# Patient Record
Sex: Male | Born: 1965 | Race: Black or African American | Hispanic: No | Marital: Married | Smoking: Never smoker
Health system: Southern US, Community
[De-identification: ages and names within clinical notes are randomized; demographics above are authoritative.]

## PROBLEM LIST (undated history)

## (undated) DIAGNOSIS — F419 Anxiety disorder, unspecified: Secondary | ICD-10-CM

## (undated) DIAGNOSIS — E079 Disorder of thyroid, unspecified: Secondary | ICD-10-CM

## (undated) DIAGNOSIS — C9590 Leukemia, unspecified not having achieved remission: Secondary | ICD-10-CM

## (undated) HISTORY — DX: Leukemia, unspecified not having achieved remission: C95.90

## (undated) HISTORY — DX: Anxiety disorder, unspecified: F41.9

## (undated) HISTORY — DX: Disorder of thyroid, unspecified: E07.9

---

## 2016-02-07 ENCOUNTER — Encounter: Payer: Self-pay | Admitting: Hematology

## 2018-01-02 ENCOUNTER — Ambulatory Visit (HOSPITAL_COMMUNITY): Payer: Self-pay | Admitting: Hematology

## 2018-01-14 ENCOUNTER — Other Ambulatory Visit: Payer: Self-pay

## 2018-01-14 ENCOUNTER — Inpatient Hospital Stay (HOSPITAL_COMMUNITY): Payer: 59

## 2018-01-14 ENCOUNTER — Inpatient Hospital Stay (HOSPITAL_COMMUNITY): Payer: 59 | Attending: Hematology | Admitting: Hematology

## 2018-01-14 ENCOUNTER — Encounter (HOSPITAL_COMMUNITY): Payer: Self-pay | Admitting: Hematology

## 2018-01-14 VITALS — BP 126/73 | HR 92 | Temp 98.5°F | Resp 16 | Ht 71.0 in | Wt 226.4 lb

## 2018-01-14 DIAGNOSIS — C9210 Chronic myeloid leukemia, BCR/ABL-positive, not having achieved remission: Secondary | ICD-10-CM

## 2018-01-14 DIAGNOSIS — E871 Hypo-osmolality and hyponatremia: Secondary | ICD-10-CM | POA: Diagnosis not present

## 2018-01-14 DIAGNOSIS — R945 Abnormal results of liver function studies: Secondary | ICD-10-CM | POA: Diagnosis not present

## 2018-01-14 DIAGNOSIS — Z808 Family history of malignant neoplasm of other organs or systems: Secondary | ICD-10-CM | POA: Diagnosis not present

## 2018-01-14 DIAGNOSIS — Z7984 Long term (current) use of oral hypoglycemic drugs: Secondary | ICD-10-CM | POA: Insufficient documentation

## 2018-01-14 DIAGNOSIS — E039 Hypothyroidism, unspecified: Secondary | ICD-10-CM

## 2018-01-14 DIAGNOSIS — E781 Pure hyperglyceridemia: Secondary | ICD-10-CM | POA: Insufficient documentation

## 2018-01-14 DIAGNOSIS — D696 Thrombocytopenia, unspecified: Secondary | ICD-10-CM | POA: Diagnosis not present

## 2018-01-14 DIAGNOSIS — E119 Type 2 diabetes mellitus without complications: Secondary | ICD-10-CM | POA: Insufficient documentation

## 2018-01-14 LAB — CBC WITH DIFFERENTIAL/PLATELET
BASOS ABS: 0.1 10*3/uL (ref 0.0–0.1)
Basophils Relative: 1 %
EOS ABS: 0 10*3/uL (ref 0.0–0.7)
Eosinophils Relative: 0 %
HCT: 38 % — ABNORMAL LOW (ref 39.0–52.0)
Hemoglobin: 12.7 g/dL — ABNORMAL LOW (ref 13.0–17.0)
Lymphocytes Relative: 44 %
Lymphs Abs: 2.3 10*3/uL (ref 0.7–4.0)
MCH: 30.7 pg (ref 26.0–34.0)
MCHC: 33.4 g/dL (ref 30.0–36.0)
MCV: 91.8 fL (ref 78.0–100.0)
Monocytes Absolute: 0.4 10*3/uL (ref 0.1–1.0)
Monocytes Relative: 8 %
NEUTROS PCT: 47 %
Neutro Abs: 2.4 10*3/uL (ref 1.7–7.7)
Platelets: 88 10*3/uL — ABNORMAL LOW (ref 150–400)
RBC: 4.14 MIL/uL — AB (ref 4.22–5.81)
RDW: 14.9 % (ref 11.5–15.5)
WBC: 5.2 10*3/uL (ref 4.0–10.5)

## 2018-01-14 LAB — COMPREHENSIVE METABOLIC PANEL
ALT: 50 U/L — AB (ref 0–44)
AST: 110 U/L — AB (ref 15–41)
Albumin: 3.8 g/dL (ref 3.5–5.0)
Alkaline Phosphatase: 63 U/L (ref 38–126)
Anion gap: 7 (ref 5–15)
BUN: 12 mg/dL (ref 6–20)
CO2: 24 mmol/L (ref 22–32)
CREATININE: 0.79 mg/dL (ref 0.61–1.24)
Calcium: 9.2 mg/dL (ref 8.9–10.3)
Chloride: 109 mmol/L (ref 98–111)
GFR calc Af Amer: >60 mL/min (ref >60)
GFR calc non Af Amer: >60 mL/min (ref >60)
Glucose, Bld: 111 mg/dL — ABNORMAL HIGH (ref 70–99)
Potassium: 4.2 mmol/L (ref 3.5–5.1)
SODIUM: 140 mmol/L (ref 135–145)
Total Bilirubin: 1.3 mg/dL — ABNORMAL HIGH (ref 0.3–1.2)
Total Protein: 8.1 g/dL (ref 6.5–8.1)

## 2018-01-14 LAB — TSH: TSH: 3.938 u[IU]/mL (ref 0.350–4.500)

## 2018-01-14 LAB — LIPID PANEL
Cholesterol: 184 mg/dL (ref 0–200)
HDL: 51 mg/dL (ref >40)
LDL Cholesterol: 114 mg/dL — ABNORMAL HIGH (ref 0–99)
TRIGLYCERIDES: 94 mg/dL (ref <150)
Total CHOL/HDL Ratio: 3.6 RATIO
VLDL: 19 mg/dL (ref 0–40)

## 2018-01-14 NOTE — Assessment & Plan Note (Addendum)
1.  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 -Nilotinib 300 mg twice daily started on July 05, 2012, dose reduced to 150 mg daily Monday through Friday in April 2015. -he is continuing Nilotinib without any missing doses.  He has arthralgias mainly in the shoulders and ankles for which she takes hydrocodone.  He has numbness in the toes which also started after he was started on Nilotinib which is constant, gets better on moving around. -His last BCR/ABL by quantitative PCR in February 2019 was undetectable.  Prior to that the same test in November 2018 was also undetectable.  His levels have been undetectable since April 2017.  We can consider discontinuing Nilotinib with close observation if he continues to have undetectable BCR/ABL levels after 3 years.  We have sent another PCR test today. -Otherwise we will see him back in 3 months for follow-up.  2.  Thrombocytopenia: -he has mild thrombocytopenia around 100 K since the start of Nilotinib.  Does not report any bleeding symptoms.  Today his platelet count dropped to 88.  Will closely monitor it.  3.  Hypothyroidism: - He is taking Synthroid 100 mcg daily.  TSH is 3.98.  He will continue the same dose.  4.  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  He was unable to tolerate Crestor in the past.  He has made lifestyle changes including avoidance of concentrated sugars and food juices, weight loss and exercise.  Today his LDL is mildly elevated at 114.  Total cholesterol and triglycerides are within normal limits.  HDL is 51.  5.  Diabetes: -He is on Januvia 100 mg daily.  We have sent a hemoglobin A1c level today.  6.  Elevated liver enzymes: -His ALT is elevated at 50 and AST at 110.  Bilirubin is 1.3.  We will closely monitor this.  I will asked him to cut back on his beer intake.

## 2018-01-14 NOTE — Progress Notes (Signed)
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  HISTORY OF PRESENTING ILLNESS:  Samuel Gonzales 52 y.o. 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  He was initially diagnosed in June 2013 with CML.  He was started on [place] on January 15, 2012 and was continued until June 27, 2012 with difficulty maintaining WBC in the normal range.  Nilotinib was started at 300 mg twice daily on July 05, 2012, dose reduced to 150 mg daily Monday through Friday in April 2015 secondary to elevated liver enzymes, musculoskeletal pains and cytopenias.  Since then he has been tolerating Nilotinib 150 mg daily without any problems.  He does have musculoskeletal pains including arthralgias of shoulders and ankles.  He takes hydrocodone as needed for it.  He continues to work full-time job.  Has some numbness in the toes of both feet which is constant and gets better on moving.  This has started after Nilotinib was started.  He also developed  hypothyroidism and hypertriglyceridemia as after starting Nilotinib.  They are being managed with medication.  Denies any fevers, night sweats or weight loss.  Denies any fevers or infections or hospitalizations in the last 6 months.  MEDICAL HISTORY:  Past Medical History:  Diagnosis Date  . Anxiety   . Leukemia ([place])   . Thyroid disease     SURGICAL HISTORY: History reviewed. No pertinent surgical history.  SOCIAL HISTORY: Social History   Socioeconomic History  . Marital status: Married    Spouse name: Amy Belloso  . Number of children: 1  . Years of education: Not on file  . Highest education level: High school graduate  Occupational History  . 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 Financial resource strain: Not very hard  . Food insecurity:    Worry: Never true    Inability: Never  true  . Transportation needs:    Medical: No    Non-medical: No  Tobacco Use  . Smoking status: Never Smoker  . Smokeless tobacco: Never Used  Substance and Sexual Activity  . Alcohol use: Yes    Alcohol/week: 12.6 oz    Types: 21 Cans of beer per week  . Drug use: Not Currently  . Sexual activity: Yes  Lifestyle  . Physical activity:    Days per week: 5 days    Minutes per session: 120 min  . Stress: Rather much  Relationships  . Social connections:    Talks on phone: Three times a week    Gets together: Twice a week    Attends religious service: More than 4 times per year    Active member of club or organization: No    Attends meetings of clubs or organizations: Never    Relationship status: Married  . Intimate partner violence:    Fear of current or ex partner: No    Emotionally abused: No    Physically abused: No    Forced sexual activity: No  Other Topics Concern  . Not on file  Social History Narrative  . Not on file    FAMILY HISTORY: Family History  Problem Relation Age of Onset  . Cervical cancer Mother   . Stroke Father     ALLERGIES:  has no allergies on file.  MEDICATIONS:  Current Outpatient Medications  Medication Sig Dispense Refill  .  diazepam (VALIUM) 5 MG tablet Take by mouth.    Marland Kitchen HYDROcodone-acetaminophen (NORCO/VICODIN) 5-325 MG tablet Take 1 tablet by mouth every 8 (eight) hours as needed.  0  . JANUVIA 100 MG tablet 100 MG DAILY FOR 30 DAYS  0  . levothyroxine (SYNTHROID, LEVOTHROID) 100 MCG tablet     . LORazepam (ATIVAN) 1 MG tablet 1 po tid prn nausea from chemo or panic attacks    . nilotinib (TASIGNA) 150 MG capsule Take 300 mg by mouth daily. Take on an empty stomach, 1 hr before or 2 hrs after food.     No current facility-administered medications for this visit.     REVIEW OF SYSTEMS:   Constitutional: Denies fevers, chills or abnormal night sweats.  Mild fatigue present. Eyes: Denies blurriness of vision, double vision or  watery eyes Ears, nose, mouth, throat, and face: Denies mucositis or sore throat Respiratory: Denies cough, dyspnea or wheezes Cardiovascular: Denies palpitation, chest discomfort or lower extremity swelling Gastrointestinal:  Denies nausea, heartburn or change in bowel habits Skin: Denies abnormal skin rashes Lymphatics: Denies new lymphadenopathy or easy bruising Neurological:Denies  tingling or new weaknesses.  Toes get numb occasionally. Behavioral/Psych: Mood is stable, no new changes  All other systems were reviewed with the patient and are negative.  PHYSICAL EXAMINATION: ECOG PERFORMANCE STATUS: 0 - Asymptomatic  Vitals:   01/14/18 1309  BP: 126/73  Pulse: 92  Resp: 16  Temp: 98.5 F (36.9 C)  SpO2: 100%   Filed Weights   01/14/18 1309  Weight: 226 lb 6.4 oz (102.7 kg)    GENERAL:alert, no distress and comfortable SKIN: skin color, texture, turgor are normal, no rashes or significant lesions EYES: normal, conjunctiva are pink and non-injected, sclera clear OROPHARYNX:no exudate, no erythema and lips, buccal mucosa, and tongue normal  NECK: supple, thyroid normal size, non-tender, without nodularity LYMPH:  no palpable lymphadenopathy in the cervical, axillary or inguinal LUNGS: clear to auscultation and percussion with normal breathing effort HEART: regular rate & rhythm and no murmurs and no lower extremity edema ABDOMEN:abdomen soft, non-tender and normal bowel sounds PSYCH: alert & oriented x 3 with fluent speech   LABORATORY DATA:  I have reviewed the data as listed Recent Results (from the past 2160 hour(s))  CBC with Differential/Platelet     Status: Abnormal   Collection Time: 01/14/18  2:07 PM  Result Value Ref Range   WBC 5.2 4.0 - 10.5 K/uL   RBC 4.14 (L) 4.22 - 5.81 MIL/uL   Hemoglobin 12.7 (L) 13.0 - 17.0 g/dL   HCT 38.0 (L) 39.0 - 52.0 %   MCV 91.8 78.0 - 100.0 fL   MCH 30.7 26.0 - 34.0 pg   MCHC 33.4 30.0 - 36.0 g/dL   RDW 14.9 11.5 - 15.5 %    Platelets 88 (L) 150 - 400 K/uL    Comment: SPECIMEN CHECKED FOR CLOTS   Neutrophils Relative % 47 %   Lymphocytes Relative 44 %   Monocytes Relative 8 %   Eosinophils Relative 0 %   Basophils Relative 1 %   Neutro Abs 2.4 1.7 - 7.7 K/uL   Lymphs Abs 2.3 0.7 - 4.0 K/uL   Monocytes Absolute 0.4 0.1 - 1.0 K/uL   Eosinophils Absolute 0.0 0.0 - 0.7 K/uL   Basophils Absolute 0.1 0.0 - 0.1 K/uL   Smear Review PLATELET COUNT CONFIRMED BY SMEAR     Comment: PLATELETS APPEAR DECREASED Performed at [facility], [address], [place], [place] [address]  Comprehensive metabolic panel     Status: Abnormal   Collection Time: 01/14/18  2:07 PM  Result Value Ref Range   Sodium 140 135 - 145 mmol/L   Potassium 4.2 3.5 - 5.1 mmol/L   Chloride 109 98 - 111 mmol/L    Comment: Please note change in reference range.   CO2 24 22 - 32 mmol/L   Glucose, Bld 111 (H) 70 - 99 mg/dL    Comment: Please note change in reference range.   BUN 12 6 - 20 mg/dL    Comment: Please note change in reference range.   Creatinine, Ser 0.79 0.61 - 1.24 mg/dL   Calcium 9.2 8.9 - 10.3 mg/dL   Total Protein 8.1 6.5 - 8.1 g/dL   Albumin 3.8 3.5 - 5.0 g/dL   AST 110 (H) 15 - 41 U/L   ALT 50 (H) 0 - 44 U/L    Comment: Please note change in reference range.   Alkaline Phosphatase 63 38 - 126 U/L   Total Bilirubin 1.3 (H) 0.3 - 1.2 mg/dL   GFR calc non Af Amer >60 >60 mL/min   GFR calc Af Amer >60 >60 mL/min    Comment: (NOTE) The eGFR has been calculated using the CKD EPI equation. This calculation has not been validated in all clinical situations. eGFR's persistently <60 mL/min signify possible Chronic Kidney Disease.    Anion gap 7 5 - 15    Comment: Performed at [facility], [address] [place], [place] [address]  Lipid panel     Status: Abnormal   Collection Time: 01/14/18  2:08 PM  Result Value Ref Range   Cholesterol 184 0 - 200 mg/dL   Triglycerides 94 <150 mg/dL   HDL 51 >40 mg/dL   Total  CHOL/HDL Ratio 3.6 RATIO   VLDL 19 0 - 40 mg/dL   LDL Cholesterol 114 (H) 0 - 99 mg/dL    Comment:        Total Cholesterol/HDL:CHD Risk Coronary Heart Disease Risk Table                     Men   Women  1/2 Average Risk   3.4   3.3  Average Risk       5.0   4.4  2 X Average Risk   9.6   7.1  3 X Average Risk  23.4   11.0        Use the calculated Patient Ratio above and the CHD Risk Table to determine the patient's CHD Risk.        ATP III CLASSIFICATION (LDL):  <100     mg/dL   Optimal  100-129  mg/dL   Near or Above                    Optimal  130-159  mg/dL   Borderline  160-189  mg/dL   High  >190     mg/dL   Very High Performed at [facility], [address], [place], [place] [address]   TSH     Status: None   Collection Time: 01/14/18  2:08 PM  Result Value Ref Range   TSH 3.938 0.350 - 4.500 uIU/mL    Comment: Performed by a 3rd Generation assay with a functional sensitivity of <=0.01 uIU/mL. Performed at [facility], [address] [place], [place] [address]      ASSESSMENT & PLAN:  CML (chronic myeloid leukemia) ([place]) 1.  CML  in chronic phase: - Initially started on [place] in July 2013, discontinued in December due to difficulty maintaining WBC count in the normal range. -Nilotinib 300 mg twice daily started on July 05, 2012, dose reduced to 150 mg daily Monday through Friday in April 2015. -he is continuing Nilotinib without any missing doses.  He has arthralgias mainly in the shoulders and ankles for which she takes hydrocodone.  He has numbness in the toes which also started after he was started on Nilotinib which is constant, gets better on moving around. -His last BCR/ABL by quantitative PCR in February 2019 was undetectable.  Prior to that the same test in November 2018 was also undetectable.  His levels have been undetectable since April 2017.  We can consider discontinuing Nilotinib with close observation if he continues to have undetectable BCR/ABL  levels after 3 years.  We have sent another PCR test today. -Otherwise we will see him back in 3 months for follow-up.  2.  Thrombocytopenia: -he has mild thrombocytopenia around 100 K since the start of Nilotinib.  Does not report any bleeding symptoms.  Today his platelet count dropped to 88.  Will closely monitor it.  3.  Hypothyroidism: - He is taking Synthroid 100 mcg daily.  TSH is 3.98.  He will continue the same dose.  4.  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  He was unable to tolerate Crestor in the past.  He has made lifestyle changes including avoidance of concentrated sugars and food juices, weight loss and exercise.  Today his LDL is mildly elevated at 114.  Total cholesterol and triglycerides are within normal limits.  HDL is 51.  5.  Diabetes: -He is on Januvia 100 mg daily.  We have sent a hemoglobin A1c level today.  6.  Elevated liver enzymes: -His ALT is elevated at 50 and AST at 110.  Bilirubin is 1.3.  We will closely monitor this.  I will asked him to cut back on his beer intake.    All questions were answered. The patient knows to call the clinic with any problems, questions or concerns.      Derek Jack, MD 01/14/18 6:12 PM

## 2018-01-20 LAB — BCR-ABL1, CML/ALL, PCR, QUANT

## 2018-01-21 ENCOUNTER — Other Ambulatory Visit (HOSPITAL_COMMUNITY): Payer: Self-pay | Admitting: *Deleted

## 2018-01-21 MED ORDER — HYDROCODONE-ACETAMINOPHEN 5-325 MG PO TABS: 1.0000 | ORAL_TABLET | Freq: Three times a day (TID) | ORAL | 0 refills | Status: DC | PRN

## 2018-01-27 LAB — HEMOGLOBIN A1C

## 2018-02-11 ENCOUNTER — Other Ambulatory Visit (HOSPITAL_COMMUNITY): Payer: Self-pay | Admitting: *Deleted

## 2018-02-11 MED ORDER — JANUVIA 100 MG PO TABS: ORAL_TABLET | ORAL | 2 refills | Status: DC

## 2018-02-24 ENCOUNTER — Other Ambulatory Visit (HOSPITAL_COMMUNITY): Payer: Self-pay | Admitting: *Deleted

## 2018-02-24 MED ORDER — HYDROCODONE-ACETAMINOPHEN 5-325 MG PO TABS: 1.0000 | ORAL_TABLET | Freq: Three times a day (TID) | ORAL | 0 refills | Status: DC | PRN

## 2018-02-27 ENCOUNTER — Other Ambulatory Visit (HOSPITAL_COMMUNITY): Payer: Self-pay | Admitting: *Deleted

## 2018-02-27 MED ORDER — HYDROCODONE-ACETAMINOPHEN 5-325 MG PO TABS: 1.0000 | ORAL_TABLET | Freq: Three times a day (TID) | ORAL | 0 refills | Status: DC | PRN

## 2018-03-13 ENCOUNTER — Other Ambulatory Visit (HOSPITAL_COMMUNITY): Payer: Self-pay | Admitting: *Deleted

## 2018-03-13 MED ORDER — NILOTINIB HCL 150 MG PO CAPS: 300.0000 mg | ORAL_CAPSULE | Freq: Every day | ORAL | 3 refills | Status: DC

## 2018-03-31 ENCOUNTER — Other Ambulatory Visit (HOSPITAL_COMMUNITY): Payer: Self-pay | Admitting: *Deleted

## 2018-03-31 MED ORDER — HYDROCODONE-ACETAMINOPHEN 5-325 MG PO TABS: 1.0000 | ORAL_TABLET | Freq: Three times a day (TID) | ORAL | 0 refills | Status: DC | PRN

## 2018-04-09 ENCOUNTER — Other Ambulatory Visit (HOSPITAL_COMMUNITY): Payer: 59

## 2018-04-14 NOTE — Progress Notes (Signed)
[name], [name] [address]   CLINIC:  Medical Oncology/Hematology  PCP:  Eber Hong, [place] [address] [phone]   REASON FOR VISIT: Follow-up for [facility]  CURRENT THERAPY: [place]ign[place]   INTERVAL HISTORY:  [name] 52 y.o. male returns for routine follow-up CML. Patient is here today with his wife. He had tenderness on his right upper quadrant for about 1 week and then it stopped. He has constant numbness in his feet. It is worse in the morning when he first wakes up. Once he starts moving around it gets slightly better. Since he has started the medication back it has caused some joint pain. His appetite and energy level is 100%. He Denies any night sweats, fever, chills, or weight loss.     REVIEW OF SYSTEMS:  Review of Systems  Musculoskeletal:       Joint pain  All other systems reviewed and are negative.    PAST MEDICAL/SURGICAL HISTORY:  Past Medical History:  Diagnosis Date  . Anxiety   . Leukemia ([place])   . Thyroid disease    History reviewed. No pertinent surgical history.   SOCIAL HISTORY:  Social History   Socioeconomic History  . Marital status: Married    Spouse name: Edson Deridder  . Number of children: 1  . Years of education: Not on file  . Highest education level: High school graduate  Occupational History  . 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 Financial resource strain: Not very hard  . Food insecurity:    Worry: Never true    Inability: Never true  . Transportation needs:    Medical: No    Non-medical: No  Tobacco Use  . Smoking status: Never Smoker  . Smokeless tobacco: Never Used  Substance and Sexual Activity  . Alcohol use: Yes    Alcohol/week: 21.0 standard drinks    Types: 21 Cans of beer per week  . Drug use: Not Currently  . Sexual activity: Yes  Lifestyle  . Physical activity:    Days per week: 5 days    Minutes per  session: 120 min  . Stress: Rather much  Relationships  . Social connections:    Talks on phone: Three times a week    Gets together: Twice a week    Attends religious service: More than 4 times per year    Active member of club or organization: No    Attends meetings of clubs or organizations: Never    Relationship status: Married  . Intimate partner violence:    Fear of current or ex partner: No    Emotionally abused: No    Physically abused: No    Forced sexual activity: No  Other Topics Concern  . Not on file  Social History Narrative  . Not on file    FAMILY HISTORY:  Family History  Problem Relation Age of Onset  . Cervical cancer Mother   . Stroke Father     CURRENT MEDICATIONS:  Outpatient Encounter Medications as of 04/15/2018  Medication Sig  . diazepam (VALIUM) 5 MG tablet Take by mouth.  Marland Kitchen HYDROcodone-acetaminophen (NORCO/VICODIN) 5-325 MG tablet Take 1 tablet by mouth every 8 (eight) hours as needed.  Marland Kitchen JANUVIA 100 MG tablet 100 MG DAILY FOR 30 DAYS  . levothyroxine (SYNTHROID, LEVOTHROID) 100 MCG tablet   . LORazepam (ATIVAN) 1 MG tablet 1 po tid prn nausea from chemo  or panic attacks  . nilotinib (TASIGNA) 150 MG capsule Take 2 capsules (300 mg total) by mouth daily. Take on an empty stomach, 1 hr before or 2 hrs after food.   No facility-administered encounter medications on file as of 04/15/2018.     ALLERGIES:  Not on File   PHYSICAL EXAM:  ECOG Performance status: 1  Vitals:   04/15/18 1101  BP: 133/80  Pulse: 82  Resp: 18  Temp: 98.3 F (36.8 C)  SpO2: 100%   Filed Weights   04/15/18 1101  Weight: 229 lb (103.9 kg)    Physical Exam  Constitutional: He is oriented to person, place, and time. He appears well-developed and well-nourished.  Abdominal: Soft.  Musculoskeletal: Normal range of motion.  Neurological: He is alert and oriented to person, place, and time.  Skin: Skin is warm and dry.  Psychiatric: He has a normal mood and  affect. His behavior is normal. Judgment and thought content normal.  Abdomen: No palpable hepatosplenomegaly or other masses.  No tenderness. Lymphadenopathy: No palpable lymphadenopathy.   LABORATORY DATA:  I have reviewed the labs as listed.  CBC    Component Value Date/Time   WBC 3.7 (L) 04/15/2018 0925   RBC 4.05 (L) 04/15/2018 0925   HGB 12.4 (L) 04/15/2018 0925   HCT 37.2 (L) 04/15/2018 0925   PLT 64 (L) 04/15/2018 0925   MCV 91.9 04/15/2018 0925   MCH 30.6 04/15/2018 0925   MCHC 33.3 04/15/2018 0925   RDW 15.5 04/15/2018 0925   LYMPHSABS 1.9 04/15/2018 0925   MONOABS 0.4 04/15/2018 0925   EOSABS 0.0 04/15/2018 0925   BASOSABS 0.1 04/15/2018 0925   CMP Latest Ref Rng & Units 04/15/2018 01/14/2018  Glucose 70 - 99 mg/dL 127(H) 111(H)  BUN 6 - 20 mg/dL 10 12  Creatinine 0.61 - 1.24 mg/dL 0.82 0.79  Sodium 135 - 145 mmol/L 137 140  Potassium 3.5 - 5.1 mmol/L 3.5 4.2  Chloride 98 - 111 mmol/L 104 109  CO2 22 - 32 mmol/L 24 24  Calcium 8.9 - 10.3 mg/dL 8.7(L) 9.2  Total Protein 6.5 - 8.1 g/dL 7.8 8.1  Total Bilirubin 0.3 - 1.2 mg/dL 1.6(H) 1.3(H)  Alkaline Phos 38 - 126 U/L 79 63  AST 15 - 41 U/L 140(H) 110(H)  ALT 0 - 44 U/L 57(H) 50(H)         ASSESSMENT & PLAN:   CML (chronic myeloid leukemia) ([facility]) 1.  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 -Nilotinib 300 mg twice daily started on July 05, 2012, dose reduced to 150 mg daily Monday through Friday in April 2015. -His BCR/ABL by PCR has been negative since April 2017.  Last level in July 2017 was also undetectable. - He did not take Nilotinib for 2 weeks as he had difficulty getting the medication.  He has been taking consistently since the last 1 month.  He has usual arthralgias which are controlled with hydrocodone. -His white count today is 3.7 with 38% neutrophils.  We have sent a another  BCR/ABL by quantitative PCR which is pending.  We have given follow-up appointment in 3 months with repeat blood work.  2.  Thrombocytopenia: -He has mild thrombus cytopenia around 100 K since the start of Nilotinib.  At last visit 3 months ago, his platelet count dropped to 88.  Today is 67.  I would like to image  his abdomen to rule out any underlying liver disease or splenomegaly.  I have ordered a CT scan of the abdomen with contrast.  3.  Hypothyroidism: - He is taking Synthroid 100 mcg daily without fail.  TSH was 3.98 at last visit.  4.  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  He was unable to tolerate Crestor in the past.  He has made lifestyle changes including avoidance of concentrated sugars and food juices, weight loss and exercise. -Fasting lipids at last visit were normal.  We have sent another sample today which is pending.  5.  Diabetes: -He is taking Januvia 100 mg daily.  Last hemoglobin A1c on 01/14/2018 was 6.4%.  6.  Elevated liver enzymes: -AST is 140, ALT is 57 with total bilirubin of 1.6.  I have ordered imaging of his liver.  I will asked him to cut back on his beer intake.      Orders placed this encounter:  Orders Placed This Encounter  Procedures  . CT Abdomen W Contrast  . BCR-ABL1, CML/ALL, PCR, QUANT  . Lipid panel  . TSH  . CBC with Differential/Platelet  . Comprehensive metabolic panel  . BCR-ABL1, CML/ALL, PCR, QUANT      Derek Jack, MD [place] [phone]

## 2018-04-15 ENCOUNTER — Inpatient Hospital Stay (HOSPITAL_COMMUNITY): Payer: 59 | Attending: Hematology

## 2018-04-15 ENCOUNTER — Inpatient Hospital Stay (HOSPITAL_COMMUNITY): Payer: 59 | Admitting: Hematology

## 2018-04-15 ENCOUNTER — Encounter (HOSPITAL_COMMUNITY): Payer: Self-pay | Admitting: Hematology

## 2018-04-15 VITALS — BP 133/80 | HR 82 | Temp 98.3°F | Resp 18 | Wt 229.0 lb

## 2018-04-15 DIAGNOSIS — E119 Type 2 diabetes mellitus without complications: Secondary | ICD-10-CM | POA: Insufficient documentation

## 2018-04-15 DIAGNOSIS — C9210 Chronic myeloid leukemia, BCR/ABL-positive, not having achieved remission: Secondary | ICD-10-CM | POA: Insufficient documentation

## 2018-04-15 DIAGNOSIS — E871 Hypo-osmolality and hyponatremia: Secondary | ICD-10-CM

## 2018-04-15 DIAGNOSIS — E781 Pure hyperglyceridemia: Secondary | ICD-10-CM | POA: Diagnosis not present

## 2018-04-15 DIAGNOSIS — R748 Abnormal levels of other serum enzymes: Secondary | ICD-10-CM | POA: Diagnosis not present

## 2018-04-15 DIAGNOSIS — Z7984 Long term (current) use of oral hypoglycemic drugs: Secondary | ICD-10-CM | POA: Insufficient documentation

## 2018-04-15 DIAGNOSIS — D696 Thrombocytopenia, unspecified: Secondary | ICD-10-CM

## 2018-04-15 DIAGNOSIS — E039 Hypothyroidism, unspecified: Secondary | ICD-10-CM | POA: Insufficient documentation

## 2018-04-15 LAB — CBC WITH DIFFERENTIAL/PLATELET
Basophils Absolute: 0.1 K/uL (ref 0.0–0.1)
Basophils Relative: 2 %
Eosinophils Absolute: 0 K/uL (ref 0.0–0.5)
Eosinophils Relative: 0 %
HCT: 37.2 % — ABNORMAL LOW (ref 39.0–52.0)
Hemoglobin: 12.4 g/dL — ABNORMAL LOW (ref 13.0–17.0)
Lymphocytes Relative: 50 %
Lymphs Abs: 1.9 K/uL (ref 0.7–4.0)
MCH: 30.6 pg (ref 26.0–34.0)
MCHC: 33.3 g/dL (ref 30.0–36.0)
MCV: 91.9 fL (ref 80.0–100.0)
Monocytes Absolute: 0.4 K/uL (ref 0.1–1.0)
Monocytes Relative: 10 %
Neutro Abs: 1.4 K/uL — ABNORMAL LOW (ref 1.7–7.7)
Neutrophils Relative %: 38 %
Platelets: 64 K/uL — ABNORMAL LOW (ref 150–400)
RBC: 4.05 MIL/uL — ABNORMAL LOW (ref 4.22–5.81)
RDW: 15.5 % (ref 11.5–15.5)
WBC: 3.7 K/uL — ABNORMAL LOW (ref 4.0–10.5)
nRBC: 0 % (ref 0.0–0.2)

## 2018-04-15 LAB — LIPID PANEL
Cholesterol: 179 mg/dL (ref 0–200)
HDL: 37 mg/dL — AB (ref >40)
LDL CALC: 100 mg/dL — AB (ref 0–99)
Total CHOL/HDL Ratio: 4.8 RATIO
Triglycerides: 209 mg/dL — ABNORMAL HIGH (ref <150)
VLDL: 42 mg/dL — ABNORMAL HIGH (ref 0–40)

## 2018-04-15 LAB — COMPREHENSIVE METABOLIC PANEL WITH GFR
ALT: 57 U/L — ABNORMAL HIGH (ref 0–44)
AST: 140 U/L — ABNORMAL HIGH (ref 15–41)
Albumin: 3.8 g/dL (ref 3.5–5.0)
Alkaline Phosphatase: 79 U/L (ref 38–126)
Anion gap: 9 (ref 5–15)
BUN: 10 mg/dL (ref 6–20)
CO2: 24 mmol/L (ref 22–32)
Calcium: 8.7 mg/dL — ABNORMAL LOW (ref 8.9–10.3)
Chloride: 104 mmol/L (ref 98–111)
Creatinine, Ser: 0.82 mg/dL (ref 0.61–1.24)
GFR calc Af Amer: >60 mL/min (ref >60)
GFR calc non Af Amer: >60 mL/min (ref >60)
Glucose, Bld: 127 mg/dL — ABNORMAL HIGH (ref 70–99)
Potassium: 3.5 mmol/L (ref 3.5–5.1)
Sodium: 137 mmol/L (ref 135–145)
Total Bilirubin: 1.6 mg/dL — ABNORMAL HIGH (ref 0.3–1.2)
Total Protein: 7.8 g/dL (ref 6.5–8.1)

## 2018-04-15 LAB — TSH: TSH: 2.871 u[IU]/mL (ref 0.350–4.500)

## 2018-04-15 NOTE — Assessment & Plan Note (Signed)
1.  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 -Nilotinib 300 mg twice daily started on July 05, 2012, dose reduced to 150 mg daily Monday through Friday in April 2015. -His BCR/ABL by PCR has been negative since April 2017.  Last level in July 2017 was also undetectable. - He did not take Nilotinib for 2 weeks as he had difficulty getting the medication.  He has been taking consistently since the last 1 month.  He has usual arthralgias which are controlled with hydrocodone. -His white count today is 3.7 with 38% neutrophils.  We have sent a another BCR/ABL by quantitative PCR which is pending.  We have given follow-up appointment in 3 months with repeat blood work.  2.  Thrombocytopenia: -He has mild thrombus cytopenia around 100 K since the start of Nilotinib.  At last visit 3 months ago, his platelet count dropped to 88.  Today is 31.  I would like to image his abdomen to rule out any underlying liver disease or splenomegaly.  I have ordered a CT scan of the abdomen with contrast.  3.  Hypothyroidism: - He is taking Synthroid 100 mcg daily without fail.  TSH was 3.98 at last visit.  4.  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  He was unable to tolerate Crestor in the past.  He has made lifestyle changes including avoidance of concentrated sugars and food juices, weight loss and exercise. -Fasting lipids at last visit were normal.  We have sent another sample today which is pending.  5.  Diabetes: -He is taking Januvia 100 mg daily.  Last hemoglobin A1c on 01/14/2018 was 6.4%.  6.  Elevated liver enzymes: -AST is 140, ALT is 57 with total bilirubin of 1.6.  I have ordered imaging of his liver.  I will asked him to cut back on his beer intake.

## 2018-04-15 NOTE — Patient Instructions (Signed)
[facility] at [facility] Discharge Instructions     Thank you for choosing [facility] at [facility] to provide your oncology and hematology care.  To afford each patient quality time with our provider, please arrive at least 15 minutes before your scheduled appointment time.   If you have a lab appointment with the Cancer Center please come in thru the  Main Entrance and check in at the main information desk  You need to re-schedule your appointment should you arrive 10 or more minutes late.  We strive to give you quality time with our providers, and arriving late affects you and other patients whose appointments are after yours.  Also, if you no show three or more times for appointments you may be dismissed from the clinic at the providers discretion.     Again, thank you for choosing [facility].  Our hope is that these requests will decrease the amount of time that you wait before being seen by our physicians.       _____________________________________________________________  Should you have questions after your visit to [facility], please contact our office at [phone] between the hours of 8:00 a.m. and 4:30 p.m.  Voicemails left after 4:00 p.m. will not be returned until the following business day.  For prescription refill requests, have your pharmacy contact our office and allow 72 hours.    Cancer Center Support Programs:   > Cancer Support Group  2nd Tuesday of the month 1pm-2pm, Journey Room    

## 2018-04-16 ENCOUNTER — Other Ambulatory Visit (HOSPITAL_COMMUNITY): Payer: 59

## 2018-04-16 ENCOUNTER — Ambulatory Visit (HOSPITAL_COMMUNITY): Payer: 59 | Admitting: Hematology

## 2018-04-21 LAB — BCR-ABL1, CML/ALL, PCR, QUANT

## 2018-05-05 ENCOUNTER — Other Ambulatory Visit (HOSPITAL_COMMUNITY): Payer: Self-pay | Admitting: *Deleted

## 2018-05-05 ENCOUNTER — Ambulatory Visit (HOSPITAL_COMMUNITY)
Admission: RE | Admit: 2018-05-05 | Discharge: 2018-05-05 | Disposition: A | Discharge status: Home / Self Care | Payer: 59 | Source: Ambulatory Visit | Attending: Nurse Practitioner | Admitting: Nurse Practitioner

## 2018-05-05 DIAGNOSIS — R109 Unspecified abdominal pain: Secondary | ICD-10-CM | POA: Diagnosis not present

## 2018-05-05 DIAGNOSIS — C9210 Chronic myeloid leukemia, BCR/ABL-positive, not having achieved remission: Secondary | ICD-10-CM | POA: Insufficient documentation

## 2018-05-05 DIAGNOSIS — I700 Atherosclerosis of aorta: Secondary | ICD-10-CM | POA: Diagnosis not present

## 2018-05-05 DIAGNOSIS — M5136 Other intervertebral disc degeneration, lumbar region: Secondary | ICD-10-CM | POA: Insufficient documentation

## 2018-05-05 DIAGNOSIS — K769 Liver disease, unspecified: Secondary | ICD-10-CM | POA: Insufficient documentation

## 2018-05-05 DIAGNOSIS — K8020 Calculus of gallbladder without cholecystitis without obstruction: Secondary | ICD-10-CM | POA: Diagnosis not present

## 2018-05-05 MED ORDER — IOPAMIDOL (ISOVUE-300) INJECTION 61%
100.0000 mL | Freq: Once | INTRAVENOUS | Status: AC | PRN
  Administered 2018-05-05: 100 mL via INTRAVENOUS

## 2018-05-07 MED ORDER — HYDROCODONE-ACETAMINOPHEN 5-325 MG PO TABS: 1.0000 | ORAL_TABLET | Freq: Three times a day (TID) | ORAL | 0 refills | Status: DC | PRN

## 2018-05-12 ENCOUNTER — Other Ambulatory Visit (HOSPITAL_COMMUNITY): Payer: Self-pay | Admitting: Hematology

## 2018-05-12 DIAGNOSIS — K769 Liver disease, unspecified: Secondary | ICD-10-CM

## 2018-05-20 ENCOUNTER — Ambulatory Visit (HOSPITAL_COMMUNITY)
Admission: RE | Admit: 2018-05-20 | Discharge: 2018-05-20 | Disposition: A | Discharge status: Home / Self Care | Payer: 59 | Source: Ambulatory Visit | Attending: Hematology | Admitting: Hematology

## 2018-05-20 DIAGNOSIS — K829 Disease of gallbladder, unspecified: Secondary | ICD-10-CM | POA: Insufficient documentation

## 2018-05-20 DIAGNOSIS — K769 Liver disease, unspecified: Secondary | ICD-10-CM | POA: Diagnosis not present

## 2018-05-20 DIAGNOSIS — K7460 Unspecified cirrhosis of liver: Secondary | ICD-10-CM | POA: Diagnosis not present

## 2018-05-20 MED ORDER — GADOBUTROL 1 MMOL/ML IV SOLN
10.0000 mL | Freq: Once | INTRAVENOUS | Status: AC | PRN
  Administered 2018-05-20: 10 mL via INTRAVENOUS

## 2018-06-04 ENCOUNTER — Other Ambulatory Visit (HOSPITAL_COMMUNITY): Payer: Self-pay | Admitting: Nurse Practitioner

## 2018-06-04 ENCOUNTER — Other Ambulatory Visit (HOSPITAL_COMMUNITY): Payer: Self-pay | Admitting: *Deleted

## 2018-06-04 DIAGNOSIS — C9210 Chronic myeloid leukemia, BCR/ABL-positive, not having achieved remission: Secondary | ICD-10-CM

## 2018-06-04 MED ORDER — HYDROCODONE-ACETAMINOPHEN 5-325 MG PO TABS: 1.0000 | ORAL_TABLET | Freq: Three times a day (TID) | ORAL | 0 refills | Status: DC | PRN

## 2018-06-09 ENCOUNTER — Other Ambulatory Visit (HOSPITAL_COMMUNITY): Payer: Self-pay | Admitting: *Deleted

## 2018-06-09 DIAGNOSIS — C9210 Chronic myeloid leukemia, BCR/ABL-positive, not having achieved remission: Secondary | ICD-10-CM

## 2018-06-09 MED ORDER — HYDROCODONE-ACETAMINOPHEN 5-325 MG PO TABS: 1.0000 | ORAL_TABLET | Freq: Three times a day (TID) | ORAL | 0 refills | Status: DC | PRN

## 2018-07-10 ENCOUNTER — Other Ambulatory Visit (HOSPITAL_COMMUNITY): Payer: Self-pay | Admitting: Emergency Medicine

## 2018-07-10 DIAGNOSIS — C9210 Chronic myeloid leukemia, BCR/ABL-positive, not having achieved remission: Secondary | ICD-10-CM

## 2018-07-10 MED ORDER — HYDROCODONE-ACETAMINOPHEN 5-325 MG PO TABS: 1.0000 | ORAL_TABLET | Freq: Three times a day (TID) | ORAL | 0 refills | Status: DC | PRN

## 2018-07-15 ENCOUNTER — Encounter (HOSPITAL_COMMUNITY): Payer: Self-pay | Admitting: Hematology

## 2018-07-15 ENCOUNTER — Inpatient Hospital Stay (HOSPITAL_COMMUNITY): Payer: 59

## 2018-07-15 ENCOUNTER — Inpatient Hospital Stay (HOSPITAL_COMMUNITY): Payer: 59 | Attending: Hematology | Admitting: Hematology

## 2018-07-15 ENCOUNTER — Other Ambulatory Visit: Payer: Self-pay

## 2018-07-15 VITALS — BP 139/66 | HR 74 | Temp 98.3°F | Resp 16 | Wt 232.0 lb

## 2018-07-15 DIAGNOSIS — C9210 Chronic myeloid leukemia, BCR/ABL-positive, not having achieved remission: Secondary | ICD-10-CM | POA: Diagnosis present

## 2018-07-15 DIAGNOSIS — R748 Abnormal levels of other serum enzymes: Secondary | ICD-10-CM | POA: Diagnosis not present

## 2018-07-15 DIAGNOSIS — E119 Type 2 diabetes mellitus without complications: Secondary | ICD-10-CM | POA: Insufficient documentation

## 2018-07-15 DIAGNOSIS — K7460 Unspecified cirrhosis of liver: Secondary | ICD-10-CM | POA: Diagnosis not present

## 2018-07-15 DIAGNOSIS — E039 Hypothyroidism, unspecified: Secondary | ICD-10-CM | POA: Insufficient documentation

## 2018-07-15 DIAGNOSIS — E871 Hypo-osmolality and hyponatremia: Secondary | ICD-10-CM

## 2018-07-15 DIAGNOSIS — E781 Pure hyperglyceridemia: Secondary | ICD-10-CM | POA: Insufficient documentation

## 2018-07-15 DIAGNOSIS — D696 Thrombocytopenia, unspecified: Secondary | ICD-10-CM | POA: Diagnosis not present

## 2018-07-15 DIAGNOSIS — Z7984 Long term (current) use of oral hypoglycemic drugs: Secondary | ICD-10-CM

## 2018-07-15 LAB — COMPREHENSIVE METABOLIC PANEL
ALBUMIN: 3.8 g/dL (ref 3.5–5.0)
ALT: 51 U/L — AB (ref 0–44)
AST: 111 U/L — AB (ref 15–41)
Alkaline Phosphatase: 90 U/L (ref 38–126)
Anion gap: 8 (ref 5–15)
BUN: 14 mg/dL (ref 6–20)
CHLORIDE: 106 mmol/L (ref 98–111)
CO2: 22 mmol/L (ref 22–32)
CREATININE: 0.92 mg/dL (ref 0.61–1.24)
Calcium: 9.2 mg/dL (ref 8.9–10.3)
GFR calc non Af Amer: >60 mL/min (ref >60)
Glucose, Bld: 123 mg/dL — ABNORMAL HIGH (ref 70–99)
Potassium: 4.3 mmol/L (ref 3.5–5.1)
SODIUM: 136 mmol/L (ref 135–145)
Total Bilirubin: 1 mg/dL (ref 0.3–1.2)
Total Protein: 8.1 g/dL (ref 6.5–8.1)

## 2018-07-15 LAB — CBC WITH DIFFERENTIAL/PLATELET
ABS IMMATURE GRANULOCYTES: 0 10*3/uL (ref 0.00–0.07)
BASOS ABS: 0 10*3/uL (ref 0.0–0.1)
BASOS PCT: 1 %
Eosinophils Absolute: 0.1 10*3/uL (ref 0.0–0.5)
Eosinophils Relative: 1 %
HCT: 38.8 % — ABNORMAL LOW (ref 39.0–52.0)
Hemoglobin: 12.4 g/dL — ABNORMAL LOW (ref 13.0–17.0)
IMMATURE GRANULOCYTES: 0 %
Lymphocytes Relative: 53 %
Lymphs Abs: 2.3 10*3/uL (ref 0.7–4.0)
MCH: 29.9 pg (ref 26.0–34.0)
MCHC: 32 g/dL (ref 30.0–36.0)
MCV: 93.5 fL (ref 80.0–100.0)
Monocytes Absolute: 0.4 10*3/uL (ref 0.1–1.0)
Monocytes Relative: 9 %
NEUTROS ABS: 1.5 10*3/uL — AB (ref 1.7–7.7)
NEUTROS PCT: 36 %
NRBC: 0 % (ref 0.0–0.2)
PLATELETS: 80 10*3/uL — AB (ref 150–400)
RBC: 4.15 MIL/uL — AB (ref 4.22–5.81)
RDW: 15.7 % — ABNORMAL HIGH (ref 11.5–15.5)
WBC: 4.3 10*3/uL (ref 4.0–10.5)

## 2018-07-15 LAB — TSH: TSH: 25.882 u[IU]/mL — ABNORMAL HIGH (ref 0.350–4.500)

## 2018-07-15 LAB — HEMOGLOBIN A1C
Hgb A1c MFr Bld: 6.4 % — ABNORMAL HIGH (ref 4.8–5.6)
MEAN PLASMA GLUCOSE: 136.98 mg/dL

## 2018-07-15 NOTE — Progress Notes (Signed)
[place], [place] [address]   CLINIC:  Medical Oncology/Hematology  PCP:  Eber Hong, [place] [address] [phone]   REASON FOR VISIT: Follow-up for Samuel Gonzales  CURRENT THERAPY: [place]   INTERVAL HISTORY:  [name] 53 y.o. male returns for routine follow-up for CML. He is here today with his wife. He is doing well and tolerating the medication good. He has no complaints at this time. He still has the numbness but it is stable. Denies any nausea, vomiting, or diarrhea. Denies any new pains. Had not noticed any recent bleeding such as epistaxis, hematuria or hematochezia. Denies recent chest pain on exertion, shortness of breath on minimal exertion, pre-syncopal episodes, or palpitations. Denies any numbness or tingling in hands or feet. Denies any recent fevers, infections, or recent hospitalizations. He reports his appetite at 100% and energy level at 75%.   REVIEW OF SYSTEMS:  Review of Systems  Neurological: Positive for numbness.  All other systems reviewed and are negative.    PAST MEDICAL/SURGICAL HISTORY:  Past Medical History:  Diagnosis Date  . Anxiety   . Leukemia ([place])   . Thyroid disease    History reviewed. No pertinent surgical history.   SOCIAL HISTORY:  Social History   Socioeconomic History  . Marital status: Married    Spouse name: [name]  . Number of children: 1  . Years of education: Not on file  . Highest education level: High school graduate  Occupational History  . 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 Financial resource strain: Not very hard  . Food insecurity:    Worry: Never true    Inability: Never true  . Transportation needs:    Medical: No    Non-medical: No  Tobacco Use  . Smoking status: Never Smoker  . Smokeless tobacco: Never Used  Substance and Sexual Activity  . Alcohol use: Yes    Alcohol/week: 21.0 standard  drinks    Types: 21 Cans of beer per week  . Drug use: Not Currently  . Sexual activity: Yes  Lifestyle  . Physical activity:    Days per week: 5 days    Minutes per session: 120 min  . Stress: Rather much  Relationships  . Social connections:    Talks on phone: Three times a week    Gets together: Twice a week    Attends religious service: More than 4 times per year    Active member of club or organization: No    Attends meetings of clubs or organizations: Never    Relationship status: Married  . Intimate partner violence:    Fear of current or ex partner: No    Emotionally abused: No    Physically abused: No    Forced sexual activity: No  Other Topics Concern  . Not on file  Social History Narrative  . Not on file    FAMILY HISTORY:  Family History  Problem Relation Age of Onset  . Cervical cancer Mother   . Stroke Father     CURRENT MEDICATIONS:  Outpatient Encounter Medications as of 07/15/2018  Medication Sig  . diazepam (VALIUM) 5 MG tablet Take by mouth.  Marland Kitchen HYDROcodone-acetaminophen (NORCO/VICODIN) 5-325 MG tablet Take 1 tablet by mouth every 8 (eight) hours as needed.  Marland Kitchen JANUVIA 100 MG tablet 100 MG DAILY FOR 30 DAYS  . levothyroxine (SYNTHROID, LEVOTHROID) 100 MCG tablet   .  LORazepam (ATIVAN) 1 MG tablet 1 po tid prn nausea from chemo or panic attacks  . nilotinib (TASIGNA) 150 MG capsule Take 2 capsules (300 mg total) by mouth daily. Take on an empty stomach, 1 hr before or 2 hrs after food.   No facility-administered encounter medications on file as of 07/15/2018.     ALLERGIES:  Not on File   PHYSICAL EXAM:  ECOG Performance status: 1  Vitals:   07/15/18 1136  BP: 139/66  Pulse: 74  Resp: 16  Temp: 98.3 F (36.8 C)  SpO2: 100%   Filed Weights   07/15/18 1136  Weight: 232 lb (105.2 kg)    Physical Exam Constitutional:      Appearance: Normal appearance. He is normal weight.  Musculoskeletal: Normal range of motion.  Skin:    General:  Skin is warm and dry.  Neurological:     Mental Status: He is alert and oriented to person, place, and time. Mental status is at baseline.  Psychiatric:        Mood and Affect: Mood normal.        Behavior: Behavior normal.        Thought Content: Thought content normal.        Judgment: Judgment normal.   Abdomen: No palpable splenomegaly.  Liver edge palpable. -No palpable lymphadenopathy.   LABORATORY DATA:  I have reviewed the labs as listed.  CBC    Component Value Date/Time   WBC 4.3 07/15/2018 1036   RBC 4.15 (L) 07/15/2018 1036   HGB 12.4 (L) 07/15/2018 1036   HCT 38.8 (L) 07/15/2018 1036   PLT 80 (L) 07/15/2018 1036   MCV 93.5 07/15/2018 1036   MCH 29.9 07/15/2018 1036   MCHC 32.0 07/15/2018 1036   RDW 15.7 (H) 07/15/2018 1036   LYMPHSABS 2.3 07/15/2018 1036   MONOABS 0.4 07/15/2018 1036   EOSABS 0.1 07/15/2018 1036   BASOSABS 0.0 07/15/2018 1036   CMP Latest Ref Rng & Units 07/15/2018 04/15/2018 01/14/2018  Glucose 70 - 99 mg/dL 123(H) 127(H) 111(H)  BUN 6 - 20 mg/dL 14 10 12   Creatinine 0.61 - 1.24 mg/dL 0.92 0.82 0.79  Sodium 135 - 145 mmol/L 136 137 140  Potassium 3.5 - 5.1 mmol/L 4.3 3.5 4.2  Chloride 98 - 111 mmol/L 106 104 109  CO2 22 - 32 mmol/L 22 24 24   Calcium 8.9 - 10.3 mg/dL 9.2 8.7(L) 9.2  Total Protein 6.5 - 8.1 g/dL 8.1 7.8 8.1  Total Bilirubin 0.3 - 1.2 mg/dL 1.0 1.6(H) 1.3(H)  Alkaline Phos 38 - 126 U/L 90 79 63  AST 15 - 41 U/L 111(H) 140(H) 110(H)  ALT 0 - 44 U/L 51(H) 57(H) 50(H)       DIAGNOSTIC IMAGING:  I have independently reviewed the scans and discussed with the patient.   I have reviewed Francene Finders, NP's note and agree with the documentation.  I personally performed a face-to-face visit, made revisions and my assessment and plan is as follows.    ASSESSMENT & PLAN:   CML (chronic myeloid leukemia) ([place]) 1.  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 -Nilotinib 300 mg twice daily started on July 05, 2012, dose reduced to 150 mg daily Monday through Friday in April 2015. -His BCR/ABL by PCR has been negative since April 2017.  Last level in July 2017 was also undetectable. - He has arthralgias  from Nilotinib which is well controlled with hydrocodone as needed. - We have reviewed his blood work.  CBC was within normal limits.  BCR/ABL by quantitative PCR is pending. - Last BCR/ABL by quantitative PCR on 04/15/2018 was undetectable. -He will continue Nilotinib at the same dose of 150 mg daily Monday through Friday. -We will see him back in 3 months for follow-up.  2.  Thrombocytopenia: -He has mild thrombocytopenia since the start of Nilotinib. -MRI of the abdomen on 05/20/2018 showed normal-sized spleen.  3.  Hypothyroidism: - He is continuing Synthroid 100 mcg daily.  We will check his TSH.  4.  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  He was unable to tolerate Crestor in the past.  He has made lifestyle changes including avoidance of concentrated sugars and food juices, weight loss and exercise. -Last fasting lipids were checked on 04/15/2018.  Mild elevation of triglycerides was noted.  5.  Diabetes: -He is continuing Januvia 100 mg daily.  We will check his hemoglobin A1c today.  6.  Elevated liver enzymes: - This is from underlying cirrhosis.  Enzymes have improved from last visit. -MRI of the abdomen on 05/20/2018 showed hepatic cirrhosis with no evidence of hepatic neoplasm.  Focal fatty infiltration in the central left hepatic lobe, corresponding with the lesion seen on previous CT.  Spleen was normal in size. -I have told him to stay away from drinking alcohol.      Orders placed this encounter:  Orders Placed This Encounter  Procedures  . BCR-ABL1, CML/ALL, PCR, QUANT  . TSH  . CBC with Differential/Platelet  . Comprehensive metabolic panel  . TSH  . Hemoglobin  A1c      Derek Jack, MD [place] [phone]

## 2018-07-15 NOTE — Assessment & Plan Note (Addendum)
1.  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 -Nilotinib 300 mg twice daily started on July 05, 2012, dose reduced to 150 mg daily Monday through Friday in April 2015. -His BCR/ABL by PCR has been negative since April 2017.  Last level in July 2017 was also undetectable. - He has arthralgias from Nilotinib which is well controlled with hydrocodone as needed. - We have reviewed his blood work.  CBC was within normal limits.  BCR/ABL by quantitative PCR is pending. - Last BCR/ABL by quantitative PCR on 04/15/2018 was undetectable. -He will continue Nilotinib at the same dose of 150 mg daily Monday through Friday. -We will see him back in 3 months for follow-up.  2.  Thrombocytopenia: -He has mild thrombocytopenia since the start of Nilotinib. -MRI of the abdomen on 05/20/2018 showed normal-sized spleen.  3.  Hypothyroidism: - He is continuing Synthroid 100 mcg daily.  We will check his TSH.  4.  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  He was unable to tolerate Crestor in the past.  He has made lifestyle changes including avoidance of concentrated sugars and food juices, weight loss and exercise. -Last fasting lipids were checked on 04/15/2018.  Mild elevation of triglycerides was noted.  5.  Diabetes: -He is continuing Januvia 100 mg daily.  We will check his hemoglobin A1c today.  6.  Elevated liver enzymes: - This is from underlying cirrhosis.  Enzymes have improved from last visit. -MRI of the abdomen on 05/20/2018 showed hepatic cirrhosis with no evidence of hepatic neoplasm.  Focal fatty infiltration in the central left hepatic lobe, corresponding with the lesion seen on previous CT.  Spleen was normal in size. -I have told him to stay away from drinking alcohol.

## 2018-07-15 NOTE — Patient Instructions (Signed)
[facility] at [facility] Discharge Instructions     Thank you for choosing [facility] at [facility] to provide your oncology and hematology care.  To afford each patient quality time with our provider, please arrive at least 15 minutes before your scheduled appointment time.   If you have a lab appointment with the Cancer Center please come in thru the  Main Entrance and check in at the main information desk  You need to re-schedule your appointment should you arrive 10 or more minutes late.  We strive to give you quality time with our providers, and arriving late affects you and other patients whose appointments are after yours.  Also, if you no show three or more times for appointments you may be dismissed from the clinic at the providers discretion.     Again, thank you for choosing [facility].  Our hope is that these requests will decrease the amount of time that you wait before being seen by our physicians.       _____________________________________________________________  Should you have questions after your visit to [facility], please contact our office at [phone] between the hours of 8:00 a.m. and 4:30 p.m.  Voicemails left after 4:00 p.m. will not be returned until the following business day.  For prescription refill requests, have your pharmacy contact our office and allow 72 hours.    Cancer Center Support Programs:   > Cancer Support Group  2nd Tuesday of the month 1pm-2pm, Journey Room    

## 2018-07-18 LAB — BCR-ABL1, CML/ALL, PCR, QUANT

## 2018-07-23 ENCOUNTER — Other Ambulatory Visit (HOSPITAL_COMMUNITY): Payer: Self-pay | Admitting: Hematology

## 2018-07-23 DIAGNOSIS — E039 Hypothyroidism, unspecified: Secondary | ICD-10-CM

## 2018-07-23 MED ORDER — LEVOTHYROXINE SODIUM 150 MCG PO TABS: 150.0000 ug | ORAL_TABLET | Freq: Every day | ORAL | 0 refills | Status: DC

## 2018-08-18 ENCOUNTER — Other Ambulatory Visit (HOSPITAL_COMMUNITY): Payer: Self-pay | Admitting: Emergency Medicine

## 2018-08-18 DIAGNOSIS — C9210 Chronic myeloid leukemia, BCR/ABL-positive, not having achieved remission: Secondary | ICD-10-CM

## 2018-08-18 MED ORDER — HYDROCODONE-ACETAMINOPHEN 5-325 MG PO TABS: 1.0000 | ORAL_TABLET | Freq: Three times a day (TID) | ORAL | 0 refills | Status: DC | PRN

## 2018-08-19 ENCOUNTER — Telehealth (HOSPITAL_COMMUNITY): Payer: Self-pay | Admitting: *Deleted

## 2018-08-19 ENCOUNTER — Other Ambulatory Visit (HOSPITAL_COMMUNITY): Payer: Self-pay | Admitting: Nurse Practitioner

## 2018-08-19 DIAGNOSIS — C9210 Chronic myeloid leukemia, BCR/ABL-positive, not having achieved remission: Secondary | ICD-10-CM

## 2018-08-19 MED ORDER — HYDROCODONE-ACETAMINOPHEN 5-325 MG PO TABS: 1.0000 | ORAL_TABLET | Freq: Three times a day (TID) | ORAL | 0 refills | Status: DC | PRN

## 2018-08-19 NOTE — Telephone Encounter (Signed)
Returned call to wife, Sharyn Lull, who called stating pts hydrocodone was called into the wrong pharmacy. Francene Finders, NP made aware [facility] [address] in [place] per pt and wifes request.

## 2018-09-18 ENCOUNTER — Other Ambulatory Visit (HOSPITAL_COMMUNITY): Payer: Self-pay | Admitting: *Deleted

## 2018-09-18 DIAGNOSIS — C9210 Chronic myeloid leukemia, BCR/ABL-positive, not having achieved remission: Secondary | ICD-10-CM

## 2018-09-18 MED ORDER — HYDROCODONE-ACETAMINOPHEN 5-325 MG PO TABS: 1.0000 | ORAL_TABLET | Freq: Three times a day (TID) | ORAL | 0 refills | Status: DC | PRN

## 2018-09-22 ENCOUNTER — Telehealth (HOSPITAL_COMMUNITY): Payer: Self-pay

## 2018-09-22 ENCOUNTER — Other Ambulatory Visit (HOSPITAL_COMMUNITY): Payer: Self-pay | Admitting: Hematology

## 2018-09-22 ENCOUNTER — Other Ambulatory Visit (HOSPITAL_COMMUNITY): Payer: Self-pay | Admitting: *Deleted

## 2018-09-22 DIAGNOSIS — C9210 Chronic myeloid leukemia, BCR/ABL-positive, not having achieved remission: Secondary | ICD-10-CM

## 2018-09-22 MED ORDER — HYDROCODONE-ACETAMINOPHEN 5-325 MG PO TABS: 1.0000 | ORAL_TABLET | Freq: Three times a day (TID) | ORAL | 0 refills | Status: DC | PRN

## 2018-09-22 NOTE — Telephone Encounter (Signed)
Patients wife called asking for the hydrocod[facility] to [facility] [address] in [place], [place].  This is the only script that goes to their hometown pharmacy.  Note forwarded to Dr. Delton Coombes and nurse notified.

## 2018-09-29 ENCOUNTER — Other Ambulatory Visit (HOSPITAL_COMMUNITY): Payer: Self-pay | Admitting: *Deleted

## 2018-10-14 ENCOUNTER — Other Ambulatory Visit (HOSPITAL_COMMUNITY): Payer: 59

## 2018-10-14 ENCOUNTER — Ambulatory Visit (HOSPITAL_COMMUNITY): Payer: 59 | Admitting: Hematology

## 2018-10-23 ENCOUNTER — Telehealth (HOSPITAL_COMMUNITY): Payer: Self-pay | Admitting: *Deleted

## 2018-10-23 NOTE — Telephone Encounter (Signed)
Received voicemail from patients wife, Verdene Lennert, stating that she could not read the FMLA papers that were faxed to her yesterday. She wanted to know if we could mail them instead. I returned her call and let her know that I will mail her the FMLA papers. Address verified. Original copy mailed to [address] [place], [place]. Copy made and placed in FMLA copy folder.

## 2018-11-03 ENCOUNTER — Other Ambulatory Visit (HOSPITAL_COMMUNITY): Payer: Self-pay | Admitting: *Deleted

## 2018-11-03 ENCOUNTER — Telehealth (HOSPITAL_COMMUNITY): Payer: Self-pay | Admitting: Hematology

## 2018-11-03 DIAGNOSIS — C9210 Chronic myeloid leukemia, BCR/ABL-positive, not having achieved remission: Secondary | ICD-10-CM

## 2018-11-03 MED ORDER — HYDROCODONE-ACETAMINOPHEN 5-325 MG PO TABS: 1.0000 | ORAL_TABLET | Freq: Three times a day (TID) | ORAL | 0 refills | Status: DC | PRN

## 2018-11-03 NOTE — Telephone Encounter (Signed)
Emailed copy of FMLA papers to pts wife

## 2018-12-02 ENCOUNTER — Other Ambulatory Visit (HOSPITAL_COMMUNITY): Payer: Self-pay | Admitting: *Deleted

## 2018-12-02 ENCOUNTER — Other Ambulatory Visit (HOSPITAL_COMMUNITY): Payer: Self-pay | Admitting: Hematology

## 2018-12-02 DIAGNOSIS — C9210 Chronic myeloid leukemia, BCR/ABL-positive, not having achieved remission: Secondary | ICD-10-CM

## 2018-12-02 MED ORDER — HYDROCODONE-ACETAMINOPHEN 5-325 MG PO TABS: 1.0000 | ORAL_TABLET | Freq: Three times a day (TID) | ORAL | 0 refills | Status: DC | PRN

## 2018-12-02 NOTE — Telephone Encounter (Signed)
Patient's wife called into clinic stating he needed a refill for hydrocodone 5 mg as needed for pain. Routed prescription request to provider. [facility] [place] [facility]. Called patient's wife to let her know but no answer left message for patient's wife to call back to clinic.

## 2018-12-04 ENCOUNTER — Other Ambulatory Visit (HOSPITAL_COMMUNITY): Payer: Self-pay | Admitting: *Deleted

## 2018-12-04 DIAGNOSIS — C9210 Chronic myeloid leukemia, BCR/ABL-positive, not having achieved remission: Secondary | ICD-10-CM

## 2018-12-04 MED ORDER — HYDROCODONE-ACETAMINOPHEN 5-325 MG PO TABS: 1.0000 | ORAL_TABLET | Freq: Three times a day (TID) | ORAL | 0 refills | Status: DC | PRN

## 2018-12-26 ENCOUNTER — Other Ambulatory Visit (HOSPITAL_COMMUNITY): Payer: Self-pay | Admitting: Hematology

## 2018-12-26 DIAGNOSIS — E039 Hypothyroidism, unspecified: Secondary | ICD-10-CM

## 2018-12-29 ENCOUNTER — Other Ambulatory Visit (HOSPITAL_COMMUNITY): Payer: Self-pay | Admitting: *Deleted

## 2018-12-30 ENCOUNTER — Telehealth (HOSPITAL_COMMUNITY): Payer: Self-pay | Admitting: *Deleted

## 2018-12-30 NOTE — Telephone Encounter (Signed)
[facility] called to get refill orders for this patient for Synthroid 150 mcg daily. Clarification was provided by provider to refill this medication. Called pharmacy and spoke with pharmacist Tye Maryland to let her know the provider approved the refill. Cathy verbalized understanding.

## 2019-01-07 ENCOUNTER — Other Ambulatory Visit (HOSPITAL_COMMUNITY): Payer: Self-pay

## 2019-01-07 DIAGNOSIS — C9210 Chronic myeloid leukemia, BCR/ABL-positive, not having achieved remission: Secondary | ICD-10-CM

## 2019-01-07 MED ORDER — HYDROCODONE-ACETAMINOPHEN 5-325 MG PO TABS: 1.0000 | ORAL_TABLET | Freq: Three times a day (TID) | ORAL | 0 refills | Status: DC | PRN

## 2019-01-07 NOTE — Telephone Encounter (Signed)
Patient called for refill on hydrocodone.  Request sent to Dr. Delton Coombes.

## 2019-01-08 ENCOUNTER — Other Ambulatory Visit (HOSPITAL_COMMUNITY): Payer: Self-pay | Admitting: *Deleted

## 2019-01-08 DIAGNOSIS — C9210 Chronic myeloid leukemia, BCR/ABL-positive, not having achieved remission: Secondary | ICD-10-CM

## 2019-01-08 MED ORDER — HYDROCODONE-ACETAMINOPHEN 5-325 MG PO TABS: 1.0000 | ORAL_TABLET | Freq: Three times a day (TID) | ORAL | 0 refills | Status: DC | PRN

## 2019-02-04 ENCOUNTER — Other Ambulatory Visit (HOSPITAL_COMMUNITY): Payer: Self-pay | Admitting: Surgery

## 2019-02-04 DIAGNOSIS — C9210 Chronic myeloid leukemia, BCR/ABL-positive, not having achieved remission: Secondary | ICD-10-CM

## 2019-02-04 MED ORDER — HYDROCODONE-ACETAMINOPHEN 5-325 MG PO TABS: 1.0000 | ORAL_TABLET | Freq: Three times a day (TID) | ORAL | 0 refills | Status: DC | PRN

## 2019-02-06 ENCOUNTER — Other Ambulatory Visit (HOSPITAL_COMMUNITY): Payer: Self-pay | Admitting: *Deleted

## 2019-02-06 ENCOUNTER — Other Ambulatory Visit (HOSPITAL_COMMUNITY): Payer: Self-pay | Admitting: Nurse Practitioner

## 2019-02-06 DIAGNOSIS — C9210 Chronic myeloid leukemia, BCR/ABL-positive, not having achieved remission: Secondary | ICD-10-CM

## 2019-02-06 MED ORDER — HYDROCODONE-ACETAMINOPHEN 5-325 MG PO TABS: 1.0000 | ORAL_TABLET | Freq: Three times a day (TID) | ORAL | 0 refills | Status: DC | PRN

## 2019-02-24 ENCOUNTER — Telehealth (HOSPITAL_COMMUNITY): Payer: Self-pay | Admitting: *Deleted

## 2019-02-24 NOTE — Telephone Encounter (Signed)
Patient's wife called into clinic asking if the patient could get clinic labs done at PCP or could the PCP labs be done at clinic so the patient doesn't have to pay twice for lab work. Dr. Delton Coombes made aware and he stated for the patient to get clinic labs drawn at PCP. Faxed patient's wife the labs that needs to be drawn for the cancer center. Faxed communication went through successful.Called patient's wife to let her know I was faxing her the lab work that [facility] requested CBC diff, CMP, TSH and BCR-ABL,CML/ALL Quant. Wife verbalized understanding.

## 2019-03-11 ENCOUNTER — Other Ambulatory Visit (HOSPITAL_COMMUNITY): Payer: Self-pay

## 2019-03-11 DIAGNOSIS — C9210 Chronic myeloid leukemia, BCR/ABL-positive, not having achieved remission: Secondary | ICD-10-CM

## 2019-03-11 MED ORDER — HYDROCODONE-ACETAMINOPHEN 5-325 MG PO TABS: 1.0000 | ORAL_TABLET | Freq: Three times a day (TID) | ORAL | 0 refills | Status: DC | PRN

## 2019-03-18 ENCOUNTER — Other Ambulatory Visit (HOSPITAL_COMMUNITY): Payer: Self-pay | Admitting: Hematology

## 2019-04-08 ENCOUNTER — Other Ambulatory Visit (HOSPITAL_COMMUNITY): Payer: Self-pay | Admitting: *Deleted

## 2019-04-08 DIAGNOSIS — C9210 Chronic myeloid leukemia, BCR/ABL-positive, not having achieved remission: Secondary | ICD-10-CM

## 2019-04-08 MED ORDER — HYDROCODONE-ACETAMINOPHEN 5-325 MG PO TABS: 1.0000 | ORAL_TABLET | Freq: Three times a day (TID) | ORAL | 0 refills | Status: DC | PRN

## 2019-04-13 ENCOUNTER — Telehealth (HOSPITAL_COMMUNITY): Payer: Self-pay | Admitting: *Deleted

## 2019-04-13 NOTE — Telephone Encounter (Signed)
Received labs from [facility] for upcoming doctor's appointment.

## 2019-05-14 ENCOUNTER — Other Ambulatory Visit (HOSPITAL_COMMUNITY): Payer: Self-pay | Admitting: *Deleted

## 2019-05-14 DIAGNOSIS — C9210 Chronic myeloid leukemia, BCR/ABL-positive, not having achieved remission: Secondary | ICD-10-CM

## 2019-05-15 MED ORDER — HYDROCODONE-ACETAMINOPHEN 5-325 MG PO TABS: 1.0000 | ORAL_TABLET | Freq: Three times a day (TID) | ORAL | 0 refills | Status: DC | PRN

## 2019-05-18 ENCOUNTER — Other Ambulatory Visit (HOSPITAL_COMMUNITY): Payer: Self-pay | Admitting: Nurse Practitioner

## 2019-05-20 ENCOUNTER — Inpatient Hospital Stay (HOSPITAL_COMMUNITY): Payer: 59 | Attending: Hematology | Admitting: Hematology

## 2019-05-20 ENCOUNTER — Encounter (HOSPITAL_COMMUNITY): Payer: Self-pay | Admitting: Hematology

## 2019-05-20 ENCOUNTER — Other Ambulatory Visit (HOSPITAL_COMMUNITY): Payer: Self-pay | Admitting: Hematology

## 2019-05-20 DIAGNOSIS — C9210 Chronic myeloid leukemia, BCR/ABL-positive, not having achieved remission: Secondary | ICD-10-CM | POA: Diagnosis not present

## 2019-05-20 NOTE — Progress Notes (Signed)
Virtual Visit via Telephone Note  I connected with Samuel Gonzales on 05/20/19 at  3:20 PM EST by telephone and verified that I am speaking with the correct person using two identifiers.   I discussed the limitations, risks, security and privacy concerns of performing an evaluation and management service by telephone and the availability of in person appointments. I also discussed with the patient that there may be a patient responsible charge related to this service. The patient expressed understanding and agreed to proceed.   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  Thrombocytopenia 3.  Hypothyroidism 4.  Elevated liver enzymes secondary to cirrhosis   Observations/Objective: He reports that he has been taking Nilotinib at 2:30 in the afternoon along with the Januvia.  He is not missing any doses.  He has cut back on drinking alcohol.  He is continuing to work full-time.  He denies missing any doses of Synthroid.  Denies any fevers, night sweats or weight loss in the last 6 months.  He missed his appointment secondary to Covid.  Denies any nausea, vomiting.  He had musculoskeletal pains from Nilotinib.  He is taking half tablet of hydrocodone in the mornings.  Sometimes he takes twice daily.    Assessment and Plan:  1.  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 -We reviewed his BCR/ABL by quantitative PCR from 03/10/2019 which was undetectable.  This has been undetectable since April 2017. -He will continue Nilotinib at the same dose. -I will consider discussing about the possibility of discontinuing Nilotinib with close follow-up at next visit. -She will come in person to the clinic in 3 months with repeat labs on the same day.  2.  Thrombocytopenia: -He has mild thrombocytopenia since the start of Nilotinib. -MRI of the abdomen on 05/20/2018 showed normal-sized spleen.  Thrombocytopenia could be from bone  marrow suppression.  3.  Hypothyroidism: -He will continue Synthroid 100 mcg daily.  His latest TSH was 5.73 on 03/10/2019.  4.  Hyper triglyceridemia: -His triglycerides are 186.  HDL is 29.  LDL is 106.  Total cholesterol is 173. -I have counseled him to do physical exercise.  5.  Elevated liver enzymes: -Total bilirubin is 1.5.  AST is 139.  ALT is normal.  MRI of the abdomen on 05/20/2018 showed hepatic cirrhosis with no evidence of hepatic neoplasm.  Focal fatty infiltration in the central left hepatic lobe.  6.  Diabetes: -We will continue Januvia 100 mg daily.  Hemoglobin A1c on 03/10/2019 was 6.3.   Follow Up Instructions: RTC 3 months with labs on the same day.   I discussed the assessment and treatment plan with the patient. The patient was provided an opportunity to ask questions and all were answered. The patient agreed with the plan and demonstrated an understanding of the instructions.   The patient was advised to call back or seek an in-person evaluation if the symptoms worsen or if the condition fails to improve as anticipated.  I provided 16 minutes of non-face-to-face time during this encounter.   Derek Jack, MD

## 2019-06-18 ENCOUNTER — Other Ambulatory Visit (HOSPITAL_COMMUNITY): Payer: Self-pay | Admitting: Surgery

## 2019-06-18 DIAGNOSIS — C9210 Chronic myeloid leukemia, BCR/ABL-positive, not having achieved remission: Secondary | ICD-10-CM

## 2019-06-18 MED ORDER — HYDROCODONE-ACETAMINOPHEN 5-325 MG PO TABS: 1.0000 | ORAL_TABLET | Freq: Three times a day (TID) | ORAL | 0 refills | Status: DC | PRN

## 2019-07-16 ENCOUNTER — Other Ambulatory Visit (HOSPITAL_COMMUNITY): Payer: Self-pay | Admitting: *Deleted

## 2019-07-16 DIAGNOSIS — C9210 Chronic myeloid leukemia, BCR/ABL-positive, not having achieved remission: Secondary | ICD-10-CM

## 2019-07-16 MED ORDER — HYDROCODONE-ACETAMINOPHEN 5-325 MG PO TABS: 1.0000 | ORAL_TABLET | Freq: Three times a day (TID) | ORAL | 0 refills | Status: DC | PRN

## 2019-08-17 ENCOUNTER — Other Ambulatory Visit (HOSPITAL_COMMUNITY): Payer: Self-pay | Admitting: Surgery

## 2019-08-17 DIAGNOSIS — C9210 Chronic myeloid leukemia, BCR/ABL-positive, not having achieved remission: Secondary | ICD-10-CM

## 2019-08-17 MED ORDER — HYDROCODONE-ACETAMINOPHEN 5-325 MG PO TABS: 1.0000 | ORAL_TABLET | Freq: Three times a day (TID) | ORAL | 0 refills | Status: DC | PRN

## 2019-08-25 ENCOUNTER — Ambulatory Visit (HOSPITAL_COMMUNITY): Payer: 59 | Admitting: Hematology

## 2019-08-25 ENCOUNTER — Other Ambulatory Visit (HOSPITAL_COMMUNITY): Payer: 59

## 2019-09-12 ENCOUNTER — Other Ambulatory Visit (HOSPITAL_COMMUNITY): Payer: Self-pay | Admitting: Hematology

## 2019-09-22 ENCOUNTER — Other Ambulatory Visit (HOSPITAL_COMMUNITY): Payer: Self-pay | Admitting: Emergency Medicine

## 2019-09-22 DIAGNOSIS — C9210 Chronic myeloid leukemia, BCR/ABL-positive, not having achieved remission: Secondary | ICD-10-CM

## 2019-09-24 ENCOUNTER — Other Ambulatory Visit (HOSPITAL_COMMUNITY): Payer: Self-pay | Admitting: Surgery

## 2019-09-24 DIAGNOSIS — C9210 Chronic myeloid leukemia, BCR/ABL-positive, not having achieved remission: Secondary | ICD-10-CM

## 2019-09-24 MED ORDER — HYDROCODONE-ACETAMINOPHEN 5-325 MG PO TABS: 1.0000 | ORAL_TABLET | Freq: Three times a day (TID) | ORAL | 0 refills | Status: DC | PRN

## 2019-09-29 ENCOUNTER — Other Ambulatory Visit: Payer: Self-pay

## 2019-09-29 ENCOUNTER — Inpatient Hospital Stay (HOSPITAL_COMMUNITY): Payer: 59 | Admitting: Hematology

## 2019-09-29 ENCOUNTER — Encounter (HOSPITAL_COMMUNITY): Payer: Self-pay | Admitting: Hematology

## 2019-09-29 ENCOUNTER — Inpatient Hospital Stay (HOSPITAL_COMMUNITY): Payer: 59 | Attending: Hematology

## 2019-09-29 VITALS — BP 138/81 | HR 97 | Temp 97.0°F | Resp 18 | Wt 231.0 lb

## 2019-09-29 DIAGNOSIS — C9210 Chronic myeloid leukemia, BCR/ABL-positive, not having achieved remission: Secondary | ICD-10-CM | POA: Diagnosis not present

## 2019-09-29 DIAGNOSIS — R200 Anesthesia of skin: Secondary | ICD-10-CM | POA: Insufficient documentation

## 2019-09-29 DIAGNOSIS — Z7984 Long term (current) use of oral hypoglycemic drugs: Secondary | ICD-10-CM | POA: Insufficient documentation

## 2019-09-29 DIAGNOSIS — E039 Hypothyroidism, unspecified: Secondary | ICD-10-CM | POA: Diagnosis not present

## 2019-09-29 DIAGNOSIS — E119 Type 2 diabetes mellitus without complications: Secondary | ICD-10-CM | POA: Insufficient documentation

## 2019-09-29 DIAGNOSIS — R5383 Other fatigue: Secondary | ICD-10-CM | POA: Diagnosis not present

## 2019-09-29 DIAGNOSIS — E781 Pure hyperglyceridemia: Secondary | ICD-10-CM | POA: Diagnosis not present

## 2019-09-29 DIAGNOSIS — M2550 Pain in unspecified joint: Secondary | ICD-10-CM | POA: Diagnosis not present

## 2019-09-29 LAB — CBC WITH DIFFERENTIAL/PLATELET
Abs Immature Granulocytes: 0.01 10*3/uL (ref 0.00–0.07)
Basophils Absolute: 0.1 10*3/uL (ref 0.0–0.1)
Basophils Relative: 3 %
Eosinophils Absolute: 0 10*3/uL (ref 0.0–0.5)
Eosinophils Relative: 0 %
HCT: 38.5 % — ABNORMAL LOW (ref 39.0–52.0)
Hemoglobin: 12.6 g/dL — ABNORMAL LOW (ref 13.0–17.0)
Immature Granulocytes: 0 %
Lymphocytes Relative: 48 %
Lymphs Abs: 1.7 10*3/uL (ref 0.7–4.0)
MCH: 31.9 pg (ref 26.0–34.0)
MCHC: 32.7 g/dL (ref 30.0–36.0)
MCV: 97.5 fL (ref 80.0–100.0)
Monocytes Absolute: 0.4 10*3/uL (ref 0.1–1.0)
Monocytes Relative: 11 %
Neutro Abs: 1.4 10*3/uL — ABNORMAL LOW (ref 1.7–7.7)
Neutrophils Relative %: 38 %
Platelets: 57 10*3/uL — ABNORMAL LOW (ref 150–400)
RBC: 3.95 MIL/uL — ABNORMAL LOW (ref 4.22–5.81)
RDW: 15.6 % — ABNORMAL HIGH (ref 11.5–15.5)
WBC: 3.6 10*3/uL — ABNORMAL LOW (ref 4.0–10.5)
nRBC: 0 % (ref 0.0–0.2)

## 2019-09-29 LAB — LIPID PANEL
Cholesterol: 166 mg/dL (ref 0–200)
HDL: 36 mg/dL — ABNORMAL LOW (ref >40)
LDL Cholesterol: 101 mg/dL — ABNORMAL HIGH (ref 0–99)
Total CHOL/HDL Ratio: 4.6 RATIO
Triglycerides: 146 mg/dL (ref <150)
VLDL: 29 mg/dL (ref 0–40)

## 2019-09-29 LAB — COMPREHENSIVE METABOLIC PANEL
ALT: 60 U/L — ABNORMAL HIGH (ref 0–44)
AST: 147 U/L — ABNORMAL HIGH (ref 15–41)
Albumin: 3.8 g/dL (ref 3.5–5.0)
Alkaline Phosphatase: 93 U/L (ref 38–126)
Anion gap: 9 (ref 5–15)
BUN: 11 mg/dL (ref 6–20)
CO2: 25 mmol/L (ref 22–32)
Calcium: 9 mg/dL (ref 8.9–10.3)
Chloride: 105 mmol/L (ref 98–111)
Creatinine, Ser: 0.73 mg/dL (ref 0.61–1.24)
GFR calc Af Amer: >60 mL/min (ref >60)
GFR calc non Af Amer: >60 mL/min (ref >60)
Glucose, Bld: 125 mg/dL — ABNORMAL HIGH (ref 70–99)
Potassium: 3.7 mmol/L (ref 3.5–5.1)
Sodium: 139 mmol/L (ref 135–145)
Total Bilirubin: 1.2 mg/dL (ref 0.3–1.2)
Total Protein: 8.2 g/dL — ABNORMAL HIGH (ref 6.5–8.1)

## 2019-09-29 LAB — HEMOGLOBIN A1C
Hgb A1c MFr Bld: 6.1 % — ABNORMAL HIGH (ref 4.8–5.6)
Mean Plasma Glucose: 128.37 mg/dL

## 2019-09-29 LAB — TSH: TSH: 7.066 u[IU]/mL — ABNORMAL HIGH (ref 0.350–4.500)

## 2019-09-29 LAB — LACTATE DEHYDROGENASE: LDH: 233 U/L — ABNORMAL HIGH (ref 98–192)

## 2019-09-29 NOTE — Patient Instructions (Addendum)
[facility] at [facility] Discharge Instructions  You were seen today by Dr. Katragadda. He went over your recent lab results. He will see you back in 4 months for labs and follow up.   Thank you for choosing [facility] at [facility] to provide your oncology and hematology care.  To afford each patient quality time with our provider, please arrive at least 15 minutes before your scheduled appointment time.   If you have a lab appointment with the Cancer Center please come in thru the  Main Entrance and check in at the main information desk  You need to re-schedule your appointment should you arrive 10 or more minutes late.  We strive to give you quality time with our providers, and arriving late affects you and other patients whose appointments are after yours.  Also, if you no show three or more times for appointments you may be dismissed from the clinic at the providers discretion.     Again, thank you for choosing [facility].  Our hope is that these requests will decrease the amount of time that you wait before being seen by our physicians.       _____________________________________________________________  Should you have questions after your visit to [facility], please contact our office at [phone] between the hours of 8:00 a.m. and 4:30 p.m.  Voicemails left after 4:00 p.m. will not be returned until the following business day.  For prescription refill requests, have your pharmacy contact our office and allow 72 hours.    Cancer Center Support Programs:   > Cancer Support Group  2nd Tuesday of the month 1pm-2pm, Journey Room    

## 2019-09-29 NOTE — Progress Notes (Signed)
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  CURRENT THERAPY: Nilotinib 150 mg Monday through Friday.   INTERVAL HISTORY:  [name] 54 y.o. male seen for follow-up of his CML.  He reports that he has been taking Nilotinib without fail.  Appetite is 100%.  Energy levels are 50%.  Reports pain in his joints, rates 8 out of 10.  Numbness in the toes is also present.  Mild fatigue from the lot number is also stable.  Reports that he has been taking Synthroid without fail.  No fevers or infections noted.    REVIEW OF SYSTEMS:  Review of Systems  Constitutional: Positive for fatigue.  Musculoskeletal: Positive for arthralgias.  Neurological: Positive for numbness.     PAST MEDICAL/SURGICAL HISTORY:  Past Medical History:  Diagnosis Date  . Anxiety   . Leukemia ([place])   . Thyroid disease    History reviewed. No pertinent surgical history.   SOCIAL HISTORY:  Social History   Socioeconomic History  . Marital status: Married    Spouse name: Samuel Gonzales  . Number of children: 1  . Years of education: Not on file  . Highest education level: High school graduate  Occupational History  . 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 Smoking status: Never Smoker  . Smokeless tobacco: Never Used  Substance and Sexual Activity  . Alcohol use: Yes    Alcohol/week: 21.0 standard drinks    Types: 21 Cans of beer per week  . Drug use: Not Currently  . Sexual activity: Yes  Other Topics Concern  . Not on file  Social History Narrative  . Not on file   Social Determinants of Health   Financial Resource Strain:   . Difficulty of Paying Living Expenses:   Food Insecurity:   . Worried About Charity fundraiser in the Last Year:   . Arboriculturist  in the Last Year:   Transportation Needs:   . Film/video editor (Medical):   Marland Kitchen Lack of Transportation (Non-Medical):   Physical Activity:   . Days of Exercise per Week:   . Minutes of Exercise per Session:   Stress:   . Feeling of Stress :   Social Connections:   . Frequency of Communication with Friends and Family:   . Frequency of Social Gatherings with Friends and Family:   . Attends Religious Services:   . Active Member of Clubs or Organizations:   . Attends Archivist Meetings:   Marland Kitchen Marital Status:   Intimate Partner Violence:   . Fear of Current or Ex-Partner:   . Emotionally Abused:   Marland Kitchen Physically Abused:   . Sexually Abused:     FAMILY HISTORY:  Family History  Problem Relation Age of Onset  . Cervical cancer Mother   . Stroke Father     CURRENT MEDICATIONS:  Outpatient Encounter Medications as of 09/29/2019  Medication Sig  . JANUVIA 100 MG tablet TAKE 1 TABLET BY MOUTH EVERY DAY  . levothyroxine (SYNTHROID) 150 MCG tablet TAKE 1 TABLET BY MOUTH DAILY BEFORE BREAKFAST  . TASIGNA 150 MG capsule TAKE 1 CAPSULE BY MOUTH DAILY  . HYDROcodone-acetaminophen (NORCO/VICODIN) 5-325 MG tablet Take 1 tablet by mouth every 8 (eight) hours as needed. (  Patient not taking: Reported on 09/29/2019)  . LORazepam (ATIVAN) 1 MG tablet 1 po tid prn nausea from chemo or panic attacks   No facility-administered encounter medications on file as of 09/29/2019.    ALLERGIES:  Not on File   PHYSICAL EXAM:  ECOG Performance status: 0  Vitals:   09/29/19 1132  BP: 138/81  Pulse: 97  Resp: 18  Temp: (!) 97 F (36.1 C)  SpO2: 100%   Filed Weights   09/29/19 1132  Weight: 231 lb (104.8 kg)    Physical Exam Vitals reviewed.  Constitutional:      Appearance: Normal appearance.  Cardiovascular:     Rate and Rhythm: Normal rate and regular rhythm.     Heart sounds: Normal heart sounds.  Pulmonary:     Effort: Pulmonary effort is normal.     Breath sounds:  Normal breath sounds.  Abdominal:     General: There is no distension.     Palpations: Abdomen is soft.  Skin:    General: Skin is warm.  Neurological:     General: No focal deficit present.     Mental Status: He is alert and oriented to person, place, and time.  Psychiatric:        Mood and Affect: Mood normal.        Behavior: Behavior normal.      LABORATORY DATA:  I have reviewed the labs as listed.  CBC    Component Value Date/Time   WBC 3.6 (L) 09/29/2019 0928   RBC 3.95 (L) 09/29/2019 0928   HGB 12.6 (L) 09/29/2019 0928   HCT 38.5 (L) 09/29/2019 0928   PLT 57 (L) 09/29/2019 0928   MCV 97.5 09/29/2019 0928   MCH 31.9 09/29/2019 0928   MCHC 32.7 09/29/2019 0928   RDW 15.6 (H) 09/29/2019 0928   LYMPHSABS 1.7 09/29/2019 0928   MONOABS 0.4 09/29/2019 0928   EOSABS 0.0 09/29/2019 0928   BASOSABS 0.1 09/29/2019 0928   CMP Latest Ref Rng & Units 09/29/2019 07/15/2018 04/15/2018  Glucose 70 - 99 mg/dL 125(H) 123(H) 127(H)  BUN 6 - 20 mg/dL 11 14 10   Creatinine 0.61 - 1.24 mg/dL 0.73 0.92 0.82  Sodium 135 - 145 mmol/L 139 136 137  Potassium 3.5 - 5.1 mmol/L 3.7 4.3 3.5  Chloride 98 - 111 mmol/L 105 106 104  CO2 22 - 32 mmol/L 25 22 24   Calcium 8.9 - 10.3 mg/dL 9.0 9.2 8.7(L)  Total Protein 6.5 - 8.1 g/dL 8.2(H) 8.1 7.8  Total Bilirubin 0.3 - 1.2 mg/dL 1.2 1.0 1.6(H)  Alkaline Phos 38 - 126 U/L 93 90 79  AST 15 - 41 U/L 147(H) 111(H) 140(H)  ALT 0 - 44 U/L 60(H) 51(H) 57(H)       DIAGNOSTIC IMAGING:  I have independently reviewed the scans and discussed with the patient.    ASSESSMENT & PLAN:   CML (chronic myeloid leukemia) ([place]) 1.  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 -Nilotinib 300 mg twice daily started on July 05, 2012, dose reduced to 150 mg daily Monday through Friday in April 2015. -BCR/ABL by PCR has been negative since April 2017. -Last quantitative PCR on 03/10/2019 was  undetectable. -We reviewed labs from 09/29/2019.  White count is 3.6 with 38% neutrophils and absolute neutrophil count of 1400. -We have sent BCR/ABL by PCR which is pending.  We will follow up on it. -He gets diffuse body pains from Nilotinib.  He takes hydrocodone 5/325 twice daily as needed. -We will continue neratinib 150 mg daily Monday through Friday.  I plan to see him back in 4 months with repeat labs.  2.  Thrombocytopenia: -He has mild to moderate thrombocytopenia since the start of Neurontin. -MRI of the abdomen on 05/20/2018 showed normal-sized spleen.  3.  Elevated LFTs: -His AST was 147 and ALT was 60.  Total bilirubin is 1.2. -This is from underlying cirrhosis as well as Nilotinib.  4.  Hypothyroidism: -He is continuing Synthroid 150 mcg daily.  TSH today is improved to 7.0.  This was previously 25.88.  5.  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  He has been off of Crestor. -I reviewed cholesterol panel from today.  LDL is 101.  HDL is 36.  Triglycerides are 146.  VLDL is 29.  Cholesterol is 166.  6.  Diabetes: -He will continue Januvia 100 mg daily.  Hemoglobin A1c today 6.1.      Orders placed this encounter:  Orders Placed This Encounter  Procedures  . CBC with Differential  . Comprehensive metabolic panel  . TSH  . Hemoglobin A1c  . BCR-ABL1, CML/ALL, PCR, QUANT  . Lactate dehydrogenase  . Lipid panel      Derek Jack, MD [place] [phone]

## 2019-10-02 ENCOUNTER — Encounter (HOSPITAL_COMMUNITY): Payer: Self-pay | Admitting: Hematology

## 2019-10-02 DIAGNOSIS — E039 Hypothyroidism, unspecified: Secondary | ICD-10-CM | POA: Insufficient documentation

## 2019-10-02 NOTE — Assessment & Plan Note (Signed)
1.  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 -Nilotinib 300 mg twice daily started on July 05, 2012, dose reduced to 150 mg daily Monday through Friday in April 2015. -BCR/ABL by PCR has been negative since April 2017. -Last quantitative PCR on 03/10/2019 was undetectable. -We reviewed labs from 09/29/2019.  White count is 3.6 with 38% neutrophils and absolute neutrophil count of 1400. -We have sent BCR/ABL by PCR which is pending.  We will follow up on it. -He gets diffuse body pains from Nilotinib.  He takes hydrocodone 5/325 twice daily as needed. -We will continue neratinib 150 mg daily Monday through Friday.  I plan to see him back in 4 months with repeat labs.  2.  Thrombocytopenia: -He has mild to moderate thrombocytopenia since the start of Neurontin. -MRI of the abdomen on 05/20/2018 showed normal-sized spleen.  3.  Elevated LFTs: -His AST was 147 and ALT was 60.  Total bilirubin is 1.2. -This is from underlying cirrhosis as well as Nilotinib.  4.  Hypothyroidism: -He is continuing Synthroid 150 mcg daily.  TSH today is improved to 7.0.  This was previously 25.88.  5.  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  He has been off of Crestor. -I reviewed cholesterol panel from today.  LDL is 101.  HDL is 36.  Triglycerides are 146.  VLDL is 29.  Cholesterol is 166.  6.  Diabetes: -He will continue Januvia 100 mg daily.  Hemoglobin A1c today 6.1.

## 2019-10-05 LAB — BCR-ABL1, CML/ALL, PCR, QUANT: Interpretation (BCRAL):: NEGATIVE

## 2019-10-22 ENCOUNTER — Other Ambulatory Visit (HOSPITAL_COMMUNITY): Payer: Self-pay

## 2019-10-22 DIAGNOSIS — C9210 Chronic myeloid leukemia, BCR/ABL-positive, not having achieved remission: Secondary | ICD-10-CM

## 2019-10-22 MED ORDER — HYDROCODONE-ACETAMINOPHEN 5-325 MG PO TABS: 1.0000 | ORAL_TABLET | Freq: Three times a day (TID) | ORAL | 0 refills | Status: DC | PRN

## 2019-11-04 ENCOUNTER — Other Ambulatory Visit (HOSPITAL_COMMUNITY): Payer: Self-pay | Admitting: Surgery

## 2019-11-04 DIAGNOSIS — C9210 Chronic myeloid leukemia, BCR/ABL-positive, not having achieved remission: Secondary | ICD-10-CM

## 2019-11-04 MED ORDER — HYDROCODONE-ACETAMINOPHEN 5-325 MG PO TABS: 1.0000 | ORAL_TABLET | Freq: Three times a day (TID) | ORAL | 0 refills | Status: DC | PRN

## 2019-11-20 ENCOUNTER — Other Ambulatory Visit (HOSPITAL_COMMUNITY): Payer: Self-pay | Admitting: Hematology

## 2019-11-24 ENCOUNTER — Telehealth (HOSPITAL_COMMUNITY): Payer: Self-pay | Admitting: Pharmacy Technician

## 2019-11-24 NOTE — Telephone Encounter (Signed)
Oral Oncology Patient Advocate Encounter  Prior Authorization for Rae Lips has been approved.    PA# [id number] Effective dates: 11/24/2019 through 05/22/2020  Oral Oncology Clinic will continue to follow.   [place] Patient [place] Phone [phone] Fax [phone] 11/24/2019 9:00 AM

## 2019-11-24 NOTE — Telephone Encounter (Signed)
Oral Oncology Patient Advocate Encounter  Received notification from [place] that prior authorization for Januvia is required.  PA submitted on CoverMyMeds Key [id number]  Status is pending  Oral Oncology Clinic will continue to follow.  [place] Patient [place] Phone [phone] Fax [phone] 11/24/2019 9:18 AM

## 2019-11-24 NOTE — Telephone Encounter (Signed)
Oral Oncology Patient Advocate Encounter  Received notification from [place] that prior authorization for Tasigna is required.  PA submitted on CoverMyMeds Key [id number] Status is pending  Oral Oncology Clinic will continue to follow.  [place] Patient [place] Phone [phone] Fax [phone] 11/24/2019 8:59 AM

## 2019-11-24 NOTE — Telephone Encounter (Signed)
Oral Oncology Patient Advocate Encounter  Prior Authorization for Celesta Gentile has been approved.    PA# [id number] Effective dates: 11/24/2019 through 11/23/2020  Oral Oncology Clinic will continue to follow.   [place] Patient [place] Phone [phone] Fax [phone] 11/24/2019 9:21 AM

## 2019-12-09 ENCOUNTER — Other Ambulatory Visit (HOSPITAL_COMMUNITY): Payer: Self-pay | Admitting: *Deleted

## 2019-12-09 DIAGNOSIS — C9210 Chronic myeloid leukemia, BCR/ABL-positive, not having achieved remission: Secondary | ICD-10-CM

## 2019-12-09 MED ORDER — HYDROCODONE-ACETAMINOPHEN 5-325 MG PO TABS: 1.0000 | ORAL_TABLET | Freq: Three times a day (TID) | ORAL | 0 refills | Status: DC | PRN

## 2019-12-16 IMAGING — MR MR ABDOMEN WO/W CM
11 of 18 series · 27 of 48 positions shown · IV contrast (10ml gadavist)
Comparison: CT on 05/05/2018

CLINICAL DATA: Abdominal pain for 2 months. Indeterminate liver
lesion and possible hepatic cirrhosis on recent CT. Chronic myeloid
leukemia and thrombocytopenia.

EXAM:
MRI ABDOMEN WITHOUT AND WITH CONTRAST
TECHNIQUE: Multiplanar multisequence MR imaging of the abdomen was performed
both before and after the administration of intravenous contrast.
CONTRAST:  10 mL Gadavist

[Series 4: T2 · coronal · 5.0mm · 1.22mm/px · 1 of 36 slices shown (1 of 2)]
[im 1/36]
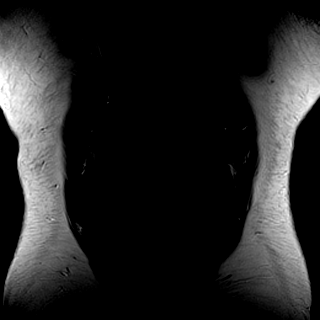

[Series 5: t2fs axial bh · axial · 5.0mm · 1.46mm/px · 1 of 40 slices shown]
[im 1/40]
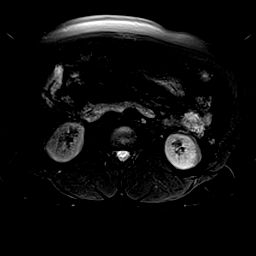

[Series 6: bSSFP · axial · 4.0mm · 0.71mm/px · z∈[-124,+152]mm · 2 of 70 slices shown]
[im 1/70]
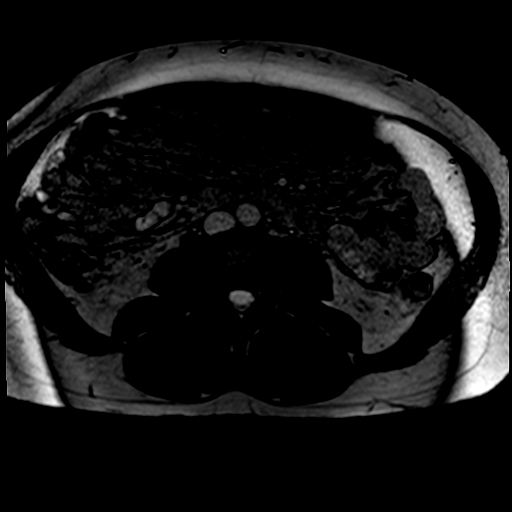
[im 70/70]
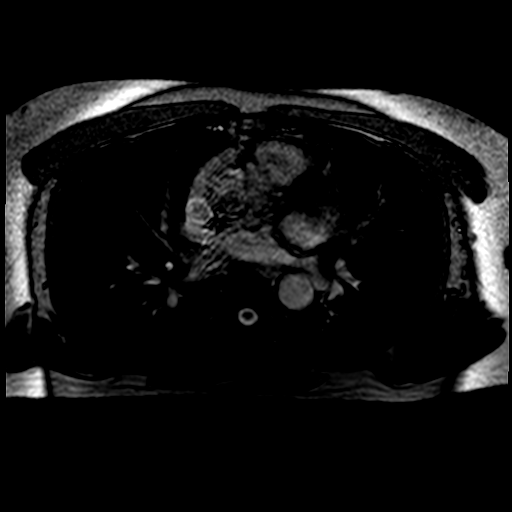

[Series 7: DWI · axial · 5.0mm · 0.99mm/px · z∈[-84,+150]mm · 3 of 80 slices shown (1 of 2)]
[im 1/80]
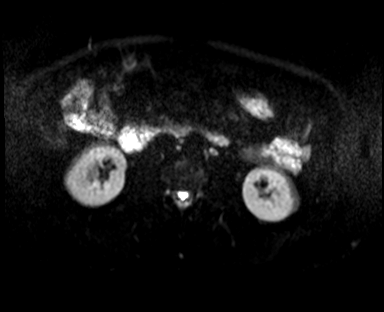
[im 40/80]
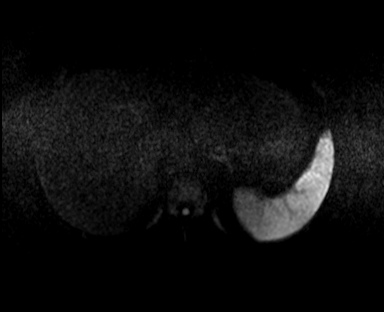
[im 80/80]
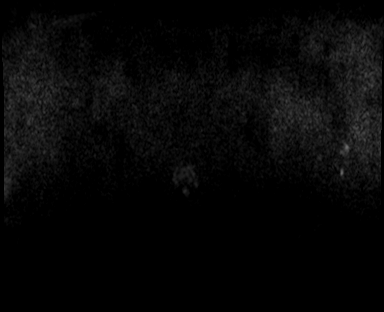

[Series 8: DWI · axial · 5.0mm · 0.99mm/px · z∈[-84,+150]mm · 2 of 40 slices shown (2 of 2)]
[im 1/40]
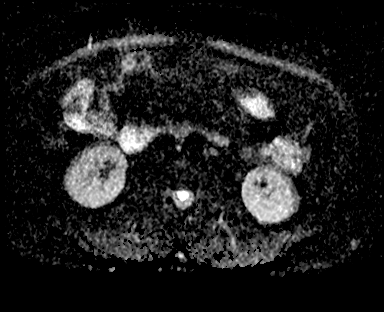
[im 40/40]
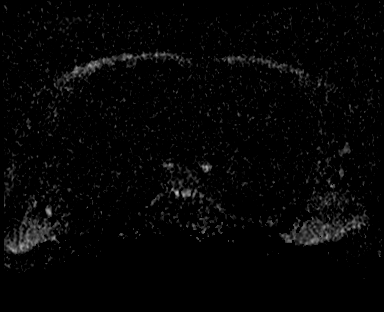

[Series 9: T1 · axial · 4.0mm · 0.59mm/px · z∈[-135,+117]mm · 3 of 64 slices shown (1 of 2)]
[im 1/64]
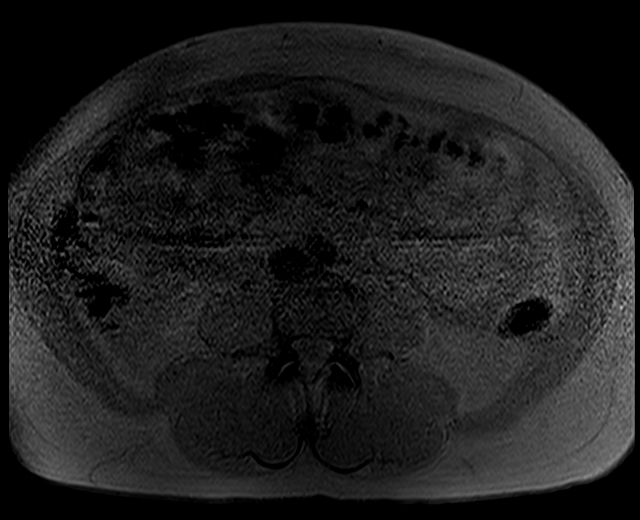
[im 32/64]
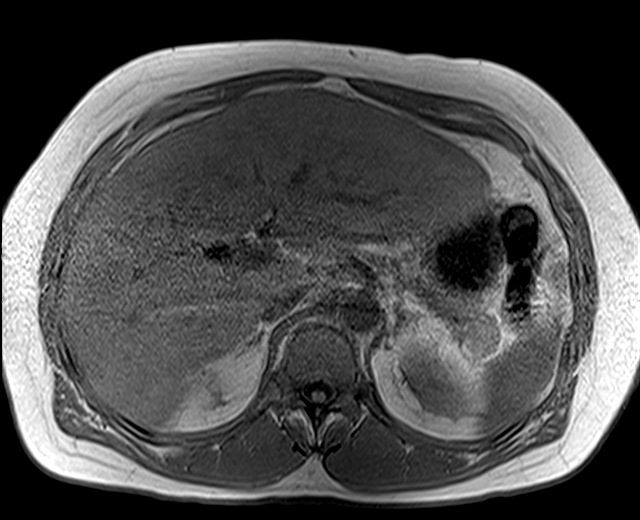
[im 64/64]
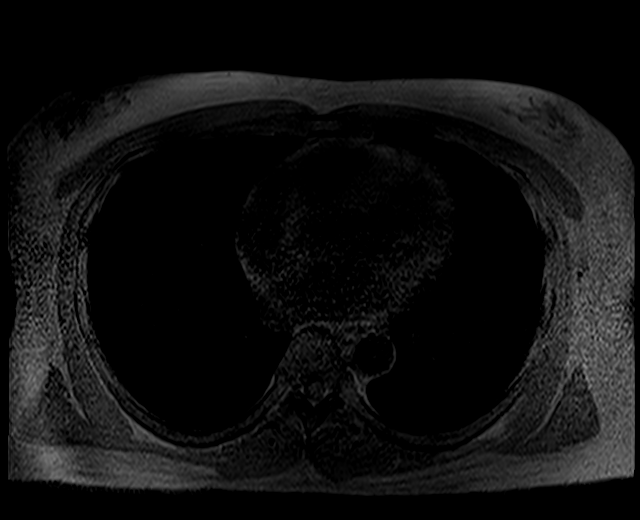

[Series 10: T1 · axial · 4.0mm · 0.59mm/px · z∈[-135,+117]mm · 3 of 64 slices shown (2 of 2)]
[im 1/64]
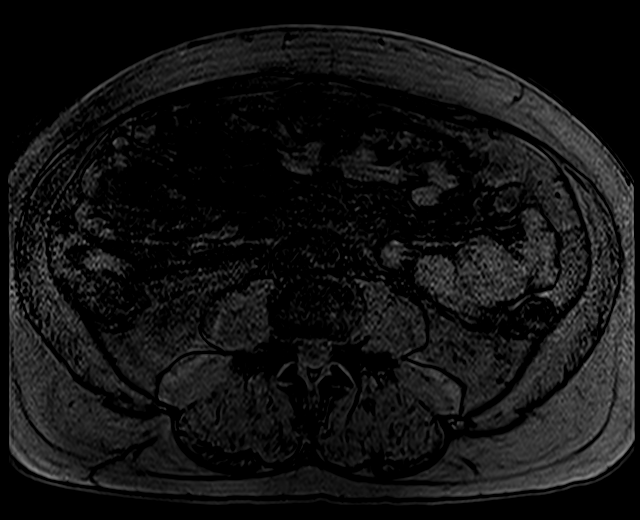
[im 32/64]
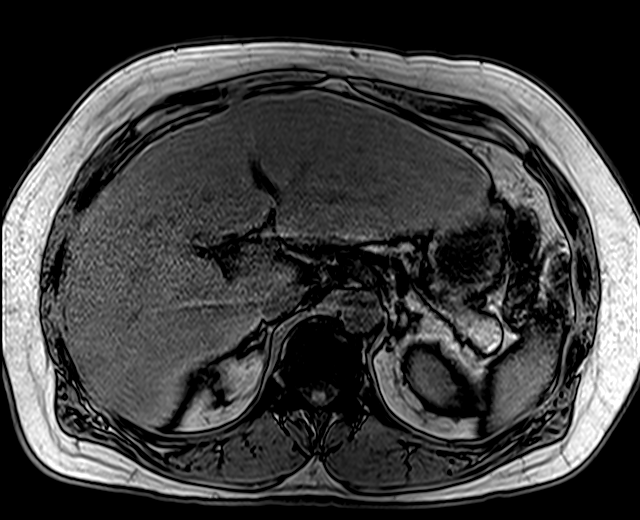
[im 64/64]
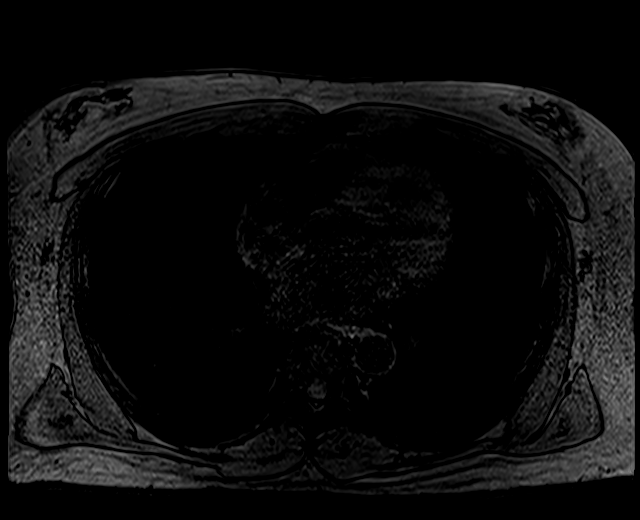

[Series 13: pre test · axial · non-contrast · 3.5mm · 0.59mm/px · z∈[-133,+115]mm · 3 of 72 slices shown]
[im 1/72]
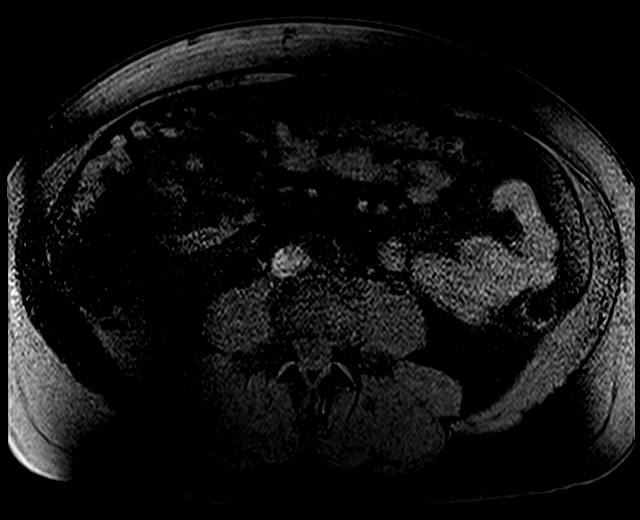
[im 36/72]
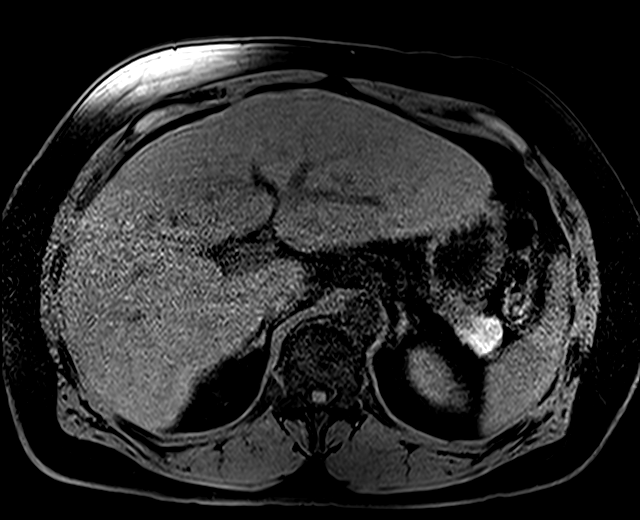
[im 72/72]
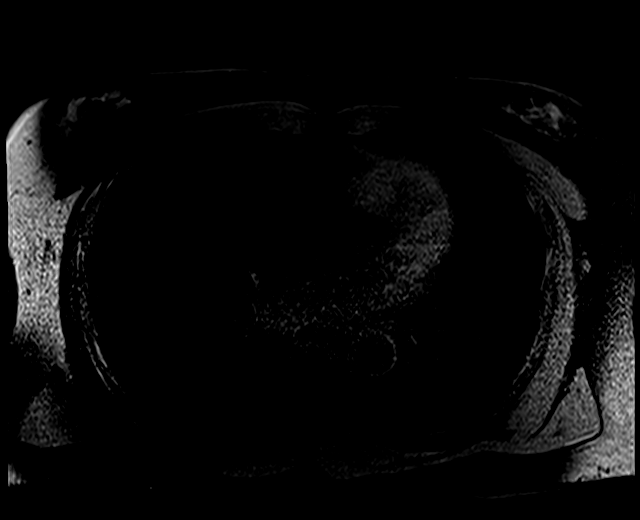

[Series 25: t1fs coronal post · coronal · 2.5mm · 1.25mm/px · 4 of 96 slices shown]
[im 1/96]
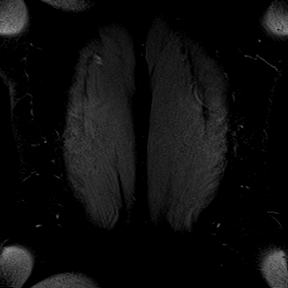
[im 32/96]
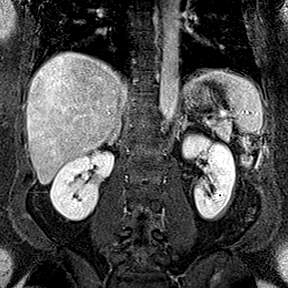
[im 64/96]
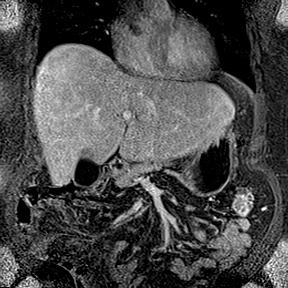
[im 96/96]
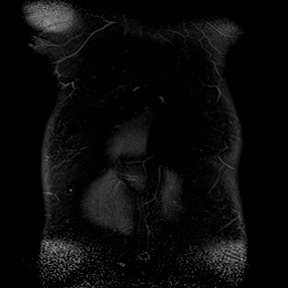

[Series 26: T2 · axial · 5.0mm · 1.38mm/px · z∈[-87,+147]mm · 2 of 40 slices shown (2 of 2)]
[im 1/40]
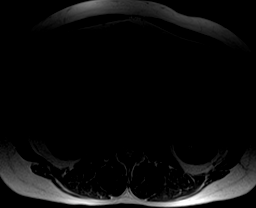
[im 40/40]
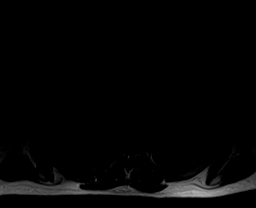

[Series 27: 5 min delay · axial · delayed · 3.5mm · 1.19mm/px · z∈[-133,+115]mm · 3 of 72 slices shown]
[im 1/72]
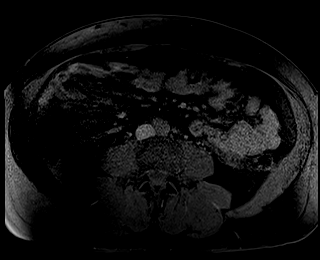
[im 36/72]
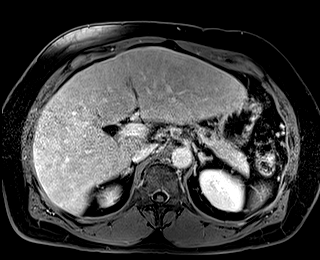
[im 72/72]
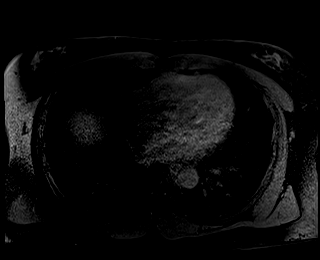

[27 of 48 positions shown; findings below may reference images not displayed]

FINDINGS: Lower chest: No acute findings.

Hepatobiliary: Hepatic cirrhosis is demonstrated. Transient arterial
phase hyperenhancement is seen in the subcapsular portions of the
posterior right lobe and anterior left lobe, consistent with
vascular shunts. No hepatic mass identified. Focal fatty
infiltration is seen in the central left hepatic lobe adjacent to
the gallbladder fossa on chemical shift imaging, which corresponds
with the lesion seen on previous CT.

Gallbladder sludge or tiny gallstones are seen, however there is no
evidence of cholecystitis or biliary ductal dilatation.

Pancreas:  No mass or inflammatory changes.

Spleen:  Within normal limits in size and appearance.

Adrenals/Urinary Tract: No masses identified. No evidence of
hydronephrosis.

Stomach/Bowel: Visualized portion unremarkable.

Vascular/Lymphatic: No pathologically enlarged lymph nodes
identified. No abdominal aortic aneurysm.

Other:  None.

Musculoskeletal:  No suspicious bone lesions identified.
IMPRESSION: Hepatic cirrhosis.  No evidence of hepatic neoplasm.

Focal fatty infiltration in the central left hepatic lobe,
corresponding with the lesion seen on previous CT.

Gallbladder sludge versus tiny gallstones. No evidence of
cholecystitis, biliary dilatation, or other acute findings.

## 2020-01-14 ENCOUNTER — Other Ambulatory Visit (HOSPITAL_COMMUNITY): Payer: Self-pay | Admitting: *Deleted

## 2020-01-14 DIAGNOSIS — C9210 Chronic myeloid leukemia, BCR/ABL-positive, not having achieved remission: Secondary | ICD-10-CM

## 2020-01-15 MED ORDER — HYDROCODONE-ACETAMINOPHEN 5-325 MG PO TABS: 1.0000 | ORAL_TABLET | Freq: Three times a day (TID) | ORAL | 0 refills | Status: DC | PRN

## 2020-01-18 ENCOUNTER — Other Ambulatory Visit (HOSPITAL_COMMUNITY): Payer: Self-pay | Admitting: *Deleted

## 2020-01-18 DIAGNOSIS — C9210 Chronic myeloid leukemia, BCR/ABL-positive, not having achieved remission: Secondary | ICD-10-CM

## 2020-01-19 MED ORDER — HYDROCODONE-ACETAMINOPHEN 5-325 MG PO TABS: 1.0000 | ORAL_TABLET | Freq: Three times a day (TID) | ORAL | 0 refills | Status: DC | PRN

## 2020-01-20 ENCOUNTER — Other Ambulatory Visit (HOSPITAL_COMMUNITY): Payer: Self-pay | Admitting: *Deleted

## 2020-01-20 DIAGNOSIS — C9210 Chronic myeloid leukemia, BCR/ABL-positive, not having achieved remission: Secondary | ICD-10-CM

## 2020-01-20 MED ORDER — HYDROCODONE-ACETAMINOPHEN 5-325 MG PO TABS: 1.0000 | ORAL_TABLET | Freq: Three times a day (TID) | ORAL | 0 refills | Status: DC | PRN

## 2020-02-02 ENCOUNTER — Inpatient Hospital Stay (HOSPITAL_COMMUNITY): Payer: 59 | Admitting: Hematology

## 2020-02-02 ENCOUNTER — Inpatient Hospital Stay (HOSPITAL_COMMUNITY): Payer: 59 | Attending: Hematology

## 2020-02-02 ENCOUNTER — Other Ambulatory Visit: Payer: Self-pay

## 2020-02-02 VITALS — BP 149/79 | HR 86 | Temp 97.8°F | Resp 18 | Wt 234.4 lb

## 2020-02-02 DIAGNOSIS — E1140 Type 2 diabetes mellitus with diabetic neuropathy, unspecified: Secondary | ICD-10-CM | POA: Insufficient documentation

## 2020-02-02 DIAGNOSIS — C9210 Chronic myeloid leukemia, BCR/ABL-positive, not having achieved remission: Secondary | ICD-10-CM

## 2020-02-02 DIAGNOSIS — E039 Hypothyroidism, unspecified: Secondary | ICD-10-CM | POA: Diagnosis not present

## 2020-02-02 DIAGNOSIS — Z7984 Long term (current) use of oral hypoglycemic drugs: Secondary | ICD-10-CM | POA: Insufficient documentation

## 2020-02-02 DIAGNOSIS — E785 Hyperlipidemia, unspecified: Secondary | ICD-10-CM | POA: Diagnosis not present

## 2020-02-02 DIAGNOSIS — D696 Thrombocytopenia, unspecified: Secondary | ICD-10-CM | POA: Insufficient documentation

## 2020-02-02 LAB — LIPID PANEL
Cholesterol: 186 mg/dL (ref 0–200)
HDL: 53 mg/dL (ref >40)
LDL Cholesterol: 115 mg/dL — ABNORMAL HIGH (ref 0–99)
Total CHOL/HDL Ratio: 3.5 RATIO
Triglycerides: 89 mg/dL (ref <150)
VLDL: 18 mg/dL (ref 0–40)

## 2020-02-02 LAB — CBC WITH DIFFERENTIAL/PLATELET
Abs Immature Granulocytes: 0.02 10*3/uL (ref 0.00–0.07)
Basophils Absolute: 0 10*3/uL (ref 0.0–0.1)
Basophils Relative: 1 %
Eosinophils Absolute: 0.1 10*3/uL (ref 0.0–0.5)
Eosinophils Relative: 2 %
HCT: 38.1 % — ABNORMAL LOW (ref 39.0–52.0)
Hemoglobin: 12.5 g/dL — ABNORMAL LOW (ref 13.0–17.0)
Immature Granulocytes: 0 %
Lymphocytes Relative: 30 %
Lymphs Abs: 1.7 10*3/uL (ref 0.7–4.0)
MCH: 31.7 pg (ref 26.0–34.0)
MCHC: 32.8 g/dL (ref 30.0–36.0)
MCV: 96.7 fL (ref 80.0–100.0)
Monocytes Absolute: 0.5 10*3/uL (ref 0.1–1.0)
Monocytes Relative: 9 %
Neutro Abs: 3.4 10*3/uL (ref 1.7–7.7)
Neutrophils Relative %: 58 %
Platelets: 56 10*3/uL — ABNORMAL LOW (ref 150–400)
RBC: 3.94 MIL/uL — ABNORMAL LOW (ref 4.22–5.81)
RDW: 15.4 % (ref 11.5–15.5)
WBC: 5.8 10*3/uL (ref 4.0–10.5)
nRBC: 0 % (ref 0.0–0.2)

## 2020-02-02 LAB — COMPREHENSIVE METABOLIC PANEL
ALT: 56 U/L — ABNORMAL HIGH (ref 0–44)
AST: 98 U/L — ABNORMAL HIGH (ref 15–41)
Albumin: 3.8 g/dL (ref 3.5–5.0)
Alkaline Phosphatase: 65 U/L (ref 38–126)
Anion gap: 9 (ref 5–15)
BUN: 14 mg/dL (ref 6–20)
CO2: 23 mmol/L (ref 22–32)
Calcium: 8.9 mg/dL (ref 8.9–10.3)
Chloride: 104 mmol/L (ref 98–111)
Creatinine, Ser: 0.77 mg/dL (ref 0.61–1.24)
GFR calc Af Amer: >60 mL/min (ref >60)
GFR calc non Af Amer: >60 mL/min (ref >60)
Glucose, Bld: 128 mg/dL — ABNORMAL HIGH (ref 70–99)
Potassium: 4.2 mmol/L (ref 3.5–5.1)
Sodium: 136 mmol/L (ref 135–145)
Total Bilirubin: 1.2 mg/dL (ref 0.3–1.2)
Total Protein: 8 g/dL (ref 6.5–8.1)

## 2020-02-02 LAB — LACTATE DEHYDROGENASE: LDH: 209 U/L — ABNORMAL HIGH (ref 98–192)

## 2020-02-02 LAB — TSH: TSH: 7.521 u[IU]/mL — ABNORMAL HIGH (ref 0.350–4.500)

## 2020-02-02 LAB — HEMOGLOBIN A1C
Hgb A1c MFr Bld: 6.1 % — ABNORMAL HIGH (ref 4.8–5.6)
Mean Plasma Glucose: 128.37 mg/dL

## 2020-02-02 MED ORDER — GABAPENTIN 300 MG PO CAPS
300.0000 mg | ORAL_CAPSULE | Freq: Every day | ORAL | 1 refills | Status: DC
Start: 2020-02-02 — End: 2020-07-06

## 2020-02-02 NOTE — Progress Notes (Signed)
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 Samuel Gonzales was last seen on 09/29/2019.  He notes that he has no major complaints and that he has been feeling pretty well.   He states that he has been eating a lot of fried foods recently, but that he has been working more frequently. He is currently not on medication for his cholesterol.   He was having some issues with his shoulder, and he was given prednisone to treat from urgent care.   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 It hurts worse at night. He said that he has to wear the right socks at night otherwise it keeps him awake and it drives him crazy.    REVIEW OF SYSTEMS:  Review of Systems  Constitutional: Negative.   HENT:  Negative.   Eyes: Negative.   Respiratory: Negative.   Cardiovascular: Negative.   Gastrointestinal: Negative.   Endocrine: Negative.   Genitourinary: Negative.    Musculoskeletal: Negative.   Skin: Negative.   Neurological: Positive for numbness (toes, bilateral).  Hematological: Negative.   Psychiatric/Behavioral: Negative.   All other systems reviewed and are negative.   PAST MEDICAL/SURGICAL HISTORY:  Past Medical History:  Diagnosis Date   Anxiety    Leukemia ([place])    Thyroid disease    No past surgical history on file.  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, chartered  Tobacco Use   Smoking status: Never Smoker   Smokeless tobacco: Never Used  Scientific laboratory technician Use: Never used  Substance and Sexual Activity   Alcohol use: Yes    Alcohol/week: 21.0 standard drinks    Types: 21 Cans of beer per week   Drug use: Not Currently   Sexual activity: Yes  Other Topics Concern   Not on file  Social History Narrative   Not on file   Social Determinants of Health   Financial Resource Strain:    Difficulty of Paying Living Expenses:   Food Insecurity:    Worried About Charity fundraiser in the Last Year:    Arboriculturist in the Last Year:   Transportation Needs:    Film/video editor (Medical):    Lack of Transportation (Non-Medical):   Physical Activity:    Days of Exercise per Week:    Minutes of Exercise per Session:   Stress:    Feeling of Stress :   Social Connections:    Frequency of Communication with Friends and Family:    Frequency of Social Gatherings with Friends and Family:    Attends Religious Services:    Active Member of Clubs or Organizations:    Attends Archivist Meetings:    Marital Status:   Intimate Partner Violence:    Fear of Current or  Ex-Partner:    Emotionally Abused:    Physically Abused:    Sexually Abused:     FAMILY HISTORY:  Family History  Problem Relation Age of Onset   Cervical cancer Mother    Stroke Father     CURRENT MEDICATIONS:  Current Outpatient Medications  Medication Sig Dispense Refill   JANUVIA 100 MG tablet TAKE 1 TABLET BY MOUTH EVERY DAY 90 tablet 6   levothyroxine (SYNTHROID) 150 MCG tablet TAKE 1 TABLET BY MOUTH DAILY BEFORE BREAKFAST 90 tablet 0   TASIGNA 150 MG capsule TAKE 1 CAPSULE BY MOUTH DAILY 84 capsule 3   allopurinol (ZYLOPRIM) 300 MG tablet Take 300 mg by mouth daily. (Patient not taking: Reported on 02/02/2020)     HYDROcodone-acetaminophen (NORCO/VICODIN) 5-325 MG tablet Take 1 tablet by mouth  every 8 (eight) hours as needed. (Patient not taking: Reported on 02/02/2020) 60 tablet 0   LORazepam (ATIVAN) 1 MG tablet 1 po tid prn nausea from chemo or panic attacks (Patient not taking: Reported on 02/02/2020)     meloxicam (MOBIC) 15 MG tablet Take 15 mg by mouth daily. (Patient not taking: Reported on 02/02/2020)     No current facility-administered medications for this visit.    ALLERGIES:  Allergies  Allergen Reactions   Levaquin [Levofloxacin]    Pecan Nut (Diagnostic)     PHYSICAL EXAM:  Performance status (ECOG): 0 - Asymptomatic  Vitals:   02/02/20 1151  BP: (!) 149/79  Pulse: 86  Resp: 18  Temp: 97.8 F (36.6 C)  SpO2: 98%   Wt Readings from Last 3 Encounters:  02/02/20 (!) 234 lb 6.4 oz (106.3 kg)  09/29/19 231 lb (104.8 kg)  07/15/18 232 lb (105.2 kg)   Physical Exam Vitals reviewed.  Constitutional:      Appearance: Normal appearance.  HENT:     Mouth/Throat:     Mouth: Mucous membranes are moist.  Eyes:     Pupils: Pupils are equal, round, and reactive to light.  Cardiovascular:     Rate and Rhythm: Normal rate and regular rhythm.     Pulses: Normal pulses.     Heart sounds: Normal heart sounds.  Pulmonary:     Effort: Pulmonary effort is normal.     Breath sounds: Normal breath sounds.  Abdominal:     Palpations: Abdomen is soft. There is no mass.     Tenderness: There is no abdominal tenderness.  Musculoskeletal:     Right lower leg: No edema.     Left lower leg: No edema.  Neurological:     Mental Status: He is alert and oriented to person, place, and time.  Psychiatric:        Mood and Affect: Mood normal.        Behavior: Behavior normal.     LABORATORY DATA:  I have reviewed the labs as listed.  CBC Latest Ref Rng & Units 02/02/2020 09/29/2019 07/15/2018  WBC 4.0 - 10.5 K/uL 5.8 3.6(L) 4.3  Hemoglobin 13.0 - 17.0 g/dL 12.5(L) 12.6(L) 12.4(L)  Hematocrit 39 - 52 % 38.1(L) 38.5(L) 38.8(L)  Platelets 150 - 400 K/uL 56(L) 57(L)  80(L)   CMP Latest Ref Rng & Units 02/02/2020 09/29/2019 07/15/2018  Glucose 70 - 99 mg/dL 128(H) 125(H) 123(H)  BUN 6 - 20 mg/dL 14 11 14   Creatinine 0.61 - 1.24 mg/dL 0.77 0.73 0.92  Sodium 135 - 145 mmol/L 136 139 136  Potassium 3.5 - 5.1 mmol/L 4.2 3.7 4.3  Chloride 98 - 111  mmol/L 104 105 106  CO2 22 - 32 mmol/L 23 25 22   Calcium 8.9 - 10.3 mg/dL 8.9 9.0 9.2  Total Protein 6.5 - 8.1 g/dL 8.0 8.2(H) 8.1  Total Bilirubin 0.3 - 1.2 mg/dL 1.2 1.2 1.0  Alkaline Phos 38 - 126 U/L 65 93 90  AST 15 - 41 U/L 98(H) 147(H) 111(H)  ALT 0 - 44 U/L 56(H) 60(H) 51(H)      Component Value Date/Time   RBC 3.94 (L) 02/02/2020 1019   MCV 96.7 02/02/2020 1019   MCH 31.7 02/02/2020 1019   MCHC 32.8 02/02/2020 1019   RDW 15.4 02/02/2020 1019   LYMPHSABS 1.7 02/02/2020 1019   MONOABS 0.5 02/02/2020 1019   EOSABS 0.1 02/02/2020 1019   BASOSABS 0.0 02/02/2020 1019    DIAGNOSTIC IMAGING:  I have independently reviewed the scans and discussed with the patient. No results found.   ASSESSMENT:  1.  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 -Nilotinib 300 mg twice daily started on July 05, 2012, dose reduced to 150 mg daily Monday through Friday in April 2015. -BCR/ABL by PCR has been negative since April 2017.  2.  Thrombocytopenia:  -He has mild to moderate thrombocytopenia since the start of admit. -MRI of the abdomen on 05/20/2018 shows normal-sized spleen but cirrhotic liver configuration.   PLAN:  1.  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  Platelet counts are 56. -BCR/ABL by quantitative PCR is undetectable.  He has a musculoskeletal pains for which he takes hydrocodone.  This is from Nilotinib. -Continue Nilotinib 150 mg daily Monday through Friday. -Return to clinic in 3 months with labs.  2.  Thrombocytopenia: -This is stable at 56.  No bleeding issues.  3.  Elevated  LFTs: -This is from underlying cirrhosis as well as Nilotinib. -AST is better at 98 and ALT of 56.  Total bilirubin is 1.2.  4.  Hypothyroidism: -Continue Synthroid 150 mcg daily.  TSH today is 7.5.  5.  Hyperlipidemia: -He could not tolerate statins in the past. -His LDL is 115.  HDL is 53.  Total cholesterol is 186.  Triglycerides are 89. -I have recommended dietary modification and exercise.  6.  Diabetes: -Hemoglobin A1c today 6.1 and stable. -Continue Januvia.   Orders placed this encounter:  No orders of the defined types were placed in this encounter.    Derek Jack, MD [facility] [phone]   I, Jacqualyn Posey, am acting as a scribe for Dr. Sanda Linger.  I, Derek Jack MD, have reviewed the above documentation for accuracy and completeness, and I agree with the above.

## 2020-02-02 NOTE — Patient Instructions (Signed)
[facility] at [facility] Discharge Instructions  You were seen today by Dr. Katragadda. He went over your recent results. Dr. Katragadda will see you back in for labs and follow up.   Thank you for choosing [facility] at [facility] to provide your oncology and hematology care.  To afford each patient quality time with our provider, please arrive at least 15 minutes before your scheduled appointment time.   If you have a lab appointment with the Cancer Center please come in thru the Main Entrance and check in at the main information desk  You need to re-schedule your appointment should you arrive 10 or more minutes late.  We strive to give you quality time with our providers, and arriving late affects you and other patients whose appointments are after yours.  Also, if you no show three or more times for appointments you may be dismissed from the clinic at the providers discretion.     Again, thank you for choosing [facility].  Our hope is that these requests will decrease the amount of time that you wait before being seen by our physicians.       _____________________________________________________________  Should you have questions after your visit to  Cancer Center, please contact our office at [phone] between the hours of 8:00 a.m. and 4:30 p.m.  Voicemails left after 4:00 p.m. will not be returned until the following business day.  For prescription refill requests, have your pharmacy contact our office and allow 72 hours.    Cancer Center Support Programs:   > Cancer Support Group  2nd Tuesday of the month 1pm-2pm, Journey Room    

## 2020-02-04 ENCOUNTER — Other Ambulatory Visit (HOSPITAL_COMMUNITY): Payer: 59

## 2020-02-04 ENCOUNTER — Ambulatory Visit (HOSPITAL_COMMUNITY): Payer: 59 | Admitting: Hematology

## 2020-02-05 LAB — BCR-ABL1, CML/ALL, PCR, QUANT: Interpretation (BCRAL):: NEGATIVE

## 2020-03-08 ENCOUNTER — Other Ambulatory Visit (HOSPITAL_COMMUNITY): Payer: Self-pay | Admitting: *Deleted

## 2020-03-08 DIAGNOSIS — C9210 Chronic myeloid leukemia, BCR/ABL-positive, not having achieved remission: Secondary | ICD-10-CM

## 2020-03-08 MED ORDER — HYDROCODONE-ACETAMINOPHEN 5-325 MG PO TABS: 1.0000 | ORAL_TABLET | Freq: Three times a day (TID) | ORAL | 0 refills | Status: DC | PRN

## 2020-03-10 ENCOUNTER — Other Ambulatory Visit (HOSPITAL_COMMUNITY): Payer: Self-pay | Admitting: Hematology

## 2020-03-10 DIAGNOSIS — E039 Hypothyroidism, unspecified: Secondary | ICD-10-CM

## 2020-04-07 ENCOUNTER — Other Ambulatory Visit (HOSPITAL_COMMUNITY): Payer: Self-pay | Admitting: *Deleted

## 2020-04-07 DIAGNOSIS — C9210 Chronic myeloid leukemia, BCR/ABL-positive, not having achieved remission: Secondary | ICD-10-CM

## 2020-04-07 MED ORDER — LORAZEPAM 1 MG PO TABS: 1.0000 mg | ORAL_TABLET | Freq: Three times a day (TID) | ORAL | 0 refills | Status: DC | PRN

## 2020-04-08 MED ORDER — HYDROCODONE-ACETAMINOPHEN 5-325 MG PO TABS: 1.0000 | ORAL_TABLET | Freq: Three times a day (TID) | ORAL | 0 refills | Status: DC | PRN

## 2020-04-11 ENCOUNTER — Other Ambulatory Visit (HOSPITAL_COMMUNITY): Payer: Self-pay | Admitting: Surgery

## 2020-04-11 DIAGNOSIS — C9210 Chronic myeloid leukemia, BCR/ABL-positive, not having achieved remission: Secondary | ICD-10-CM

## 2020-04-11 MED ORDER — HYDROCODONE-ACETAMINOPHEN 5-325 MG PO TABS: 1.0000 | ORAL_TABLET | Freq: Three times a day (TID) | ORAL | 0 refills | Status: DC | PRN

## 2020-04-27 ENCOUNTER — Other Ambulatory Visit (HOSPITAL_COMMUNITY): Payer: Self-pay

## 2020-04-27 DIAGNOSIS — C9210 Chronic myeloid leukemia, BCR/ABL-positive, not having achieved remission: Secondary | ICD-10-CM

## 2020-04-27 MED ORDER — LORAZEPAM 1 MG PO TABS: 1.0000 mg | ORAL_TABLET | Freq: Three times a day (TID) | ORAL | 5 refills | Status: DC | PRN

## 2020-05-02 ENCOUNTER — Other Ambulatory Visit (HOSPITAL_COMMUNITY): Payer: Self-pay

## 2020-05-02 DIAGNOSIS — C9210 Chronic myeloid leukemia, BCR/ABL-positive, not having achieved remission: Secondary | ICD-10-CM

## 2020-05-02 MED ORDER — HYDROCODONE-ACETAMINOPHEN 5-325 MG PO TABS: 1.0000 | ORAL_TABLET | Freq: Three times a day (TID) | ORAL | 0 refills | Status: DC | PRN

## 2020-05-04 ENCOUNTER — Other Ambulatory Visit (HOSPITAL_COMMUNITY): Payer: Self-pay

## 2020-05-04 DIAGNOSIS — C9210 Chronic myeloid leukemia, BCR/ABL-positive, not having achieved remission: Secondary | ICD-10-CM

## 2020-05-04 MED ORDER — HYDROCODONE-ACETAMINOPHEN 5-325 MG PO TABS: 1.0000 | ORAL_TABLET | Freq: Three times a day (TID) | ORAL | 0 refills | Status: DC | PRN

## 2020-05-09 ENCOUNTER — Other Ambulatory Visit (HOSPITAL_COMMUNITY): Payer: Self-pay | Admitting: Hematology

## 2020-05-09 DIAGNOSIS — E039 Hypothyroidism, unspecified: Secondary | ICD-10-CM

## 2020-05-09 DIAGNOSIS — C9210 Chronic myeloid leukemia, BCR/ABL-positive, not having achieved remission: Secondary | ICD-10-CM

## 2020-05-09 MED ORDER — LEVOTHYROXINE SODIUM 150 MCG PO TABS: ORAL_TABLET | ORAL | 6 refills | Status: DC

## 2020-05-31 ENCOUNTER — Other Ambulatory Visit (HOSPITAL_COMMUNITY): Payer: Self-pay | Admitting: *Deleted

## 2020-05-31 DIAGNOSIS — C9210 Chronic myeloid leukemia, BCR/ABL-positive, not having achieved remission: Secondary | ICD-10-CM

## 2020-05-31 MED ORDER — LORAZEPAM 1 MG PO TABS: 1.0000 mg | ORAL_TABLET | Freq: Three times a day (TID) | ORAL | 5 refills | Status: DC | PRN

## 2020-05-31 MED ORDER — HYDROCODONE-ACETAMINOPHEN 5-325 MG PO TABS: 1.0000 | ORAL_TABLET | Freq: Three times a day (TID) | ORAL | 0 refills | Status: DC | PRN

## 2020-06-14 ENCOUNTER — Inpatient Hospital Stay (HOSPITAL_COMMUNITY): Payer: 59

## 2020-06-14 ENCOUNTER — Other Ambulatory Visit: Payer: Self-pay

## 2020-06-14 ENCOUNTER — Inpatient Hospital Stay (HOSPITAL_COMMUNITY): Payer: 59 | Attending: Hematology and Oncology | Admitting: Oncology

## 2020-06-14 ENCOUNTER — Encounter (HOSPITAL_COMMUNITY): Payer: Self-pay | Admitting: Oncology

## 2020-06-14 ENCOUNTER — Ambulatory Visit (HOSPITAL_COMMUNITY)
Admission: RE | Admit: 2020-06-14 | Discharge: 2020-06-14 | Disposition: A | Discharge status: Home / Self Care | Payer: 59 | Source: Ambulatory Visit | Attending: Oncology | Admitting: Oncology

## 2020-06-14 ENCOUNTER — Other Ambulatory Visit (HOSPITAL_COMMUNITY): Payer: Self-pay | Admitting: Oncology

## 2020-06-14 VITALS — BP 107/55 | HR 117 | Temp 97.6°F | Resp 18 | Wt 240.3 lb

## 2020-06-14 DIAGNOSIS — M25512 Pain in left shoulder: Secondary | ICD-10-CM

## 2020-06-14 DIAGNOSIS — C9210 Chronic myeloid leukemia, BCR/ABL-positive, not having achieved remission: Secondary | ICD-10-CM

## 2020-06-14 DIAGNOSIS — D696 Thrombocytopenia, unspecified: Secondary | ICD-10-CM | POA: Diagnosis not present

## 2020-06-14 DIAGNOSIS — Z7984 Long term (current) use of oral hypoglycemic drugs: Secondary | ICD-10-CM | POA: Insufficient documentation

## 2020-06-14 DIAGNOSIS — E785 Hyperlipidemia, unspecified: Secondary | ICD-10-CM | POA: Insufficient documentation

## 2020-06-14 DIAGNOSIS — E1140 Type 2 diabetes mellitus with diabetic neuropathy, unspecified: Secondary | ICD-10-CM | POA: Diagnosis not present

## 2020-06-14 DIAGNOSIS — E039 Hypothyroidism, unspecified: Secondary | ICD-10-CM | POA: Insufficient documentation

## 2020-06-14 LAB — COMPREHENSIVE METABOLIC PANEL
ALT: 42 U/L (ref 0–44)
AST: 115 U/L — ABNORMAL HIGH (ref 15–41)
Albumin: 3.5 g/dL (ref 3.5–5.0)
Alkaline Phosphatase: 69 U/L (ref 38–126)
Anion gap: 7 (ref 5–15)
BUN: 11 mg/dL (ref 6–20)
CO2: 24 mmol/L (ref 22–32)
Calcium: 8.3 mg/dL — ABNORMAL LOW (ref 8.9–10.3)
Chloride: 104 mmol/L (ref 98–111)
Creatinine, Ser: 0.77 mg/dL (ref 0.61–1.24)
GFR, Estimated: >60 mL/min (ref >60)
Glucose, Bld: 169 mg/dL — ABNORMAL HIGH (ref 70–99)
Potassium: 3.6 mmol/L (ref 3.5–5.1)
Sodium: 135 mmol/L (ref 135–145)
Total Bilirubin: 1.7 mg/dL — ABNORMAL HIGH (ref 0.3–1.2)
Total Protein: 7.7 g/dL (ref 6.5–8.1)

## 2020-06-14 LAB — TSH: TSH: 3.212 u[IU]/mL (ref 0.350–4.500)

## 2020-06-14 LAB — CBC WITH DIFFERENTIAL/PLATELET
Abs Immature Granulocytes: 0.01 10*3/uL (ref 0.00–0.07)
Basophils Absolute: 0.1 10*3/uL (ref 0.0–0.1)
Basophils Relative: 2 %
Eosinophils Absolute: 0 10*3/uL (ref 0.0–0.5)
Eosinophils Relative: 0 %
HCT: 37.7 % — ABNORMAL LOW (ref 39.0–52.0)
Hemoglobin: 12.4 g/dL — ABNORMAL LOW (ref 13.0–17.0)
Immature Granulocytes: 0 %
Lymphocytes Relative: 50 %
Lymphs Abs: 2.2 10*3/uL (ref 0.7–4.0)
MCH: 31.9 pg (ref 26.0–34.0)
MCHC: 32.9 g/dL (ref 30.0–36.0)
MCV: 96.9 fL (ref 80.0–100.0)
Monocytes Absolute: 0.5 10*3/uL (ref 0.1–1.0)
Monocytes Relative: 12 %
Neutro Abs: 1.6 10*3/uL — ABNORMAL LOW (ref 1.7–7.7)
Neutrophils Relative %: 36 %
Platelets: 58 10*3/uL — ABNORMAL LOW (ref 150–400)
RBC: 3.89 MIL/uL — ABNORMAL LOW (ref 4.22–5.81)
RDW: 16 % — ABNORMAL HIGH (ref 11.5–15.5)
WBC: 4.3 10*3/uL (ref 4.0–10.5)
nRBC: 0 % (ref 0.0–0.2)

## 2020-06-14 LAB — LIPID PANEL
Cholesterol: 166 mg/dL (ref 0–200)
HDL: 32 mg/dL — ABNORMAL LOW (ref >40)
LDL Cholesterol: 95 mg/dL (ref 0–99)
Total CHOL/HDL Ratio: 5.2 RATIO
Triglycerides: 196 mg/dL — ABNORMAL HIGH (ref <150)
VLDL: 39 mg/dL (ref 0–40)

## 2020-06-14 LAB — HEMOGLOBIN A1C
Hgb A1c MFr Bld: 6.3 % — ABNORMAL HIGH (ref 4.8–5.6)
Mean Plasma Glucose: 134.11 mg/dL

## 2020-06-14 LAB — LACTATE DEHYDROGENASE: LDH: 240 U/L — ABNORMAL HIGH (ref 98–192)

## 2020-06-14 MED ORDER — HYDROCODONE-ACETAMINOPHEN 5-325 MG PO TABS: 1.0000 | ORAL_TABLET | Freq: Three times a day (TID) | ORAL | 0 refills | Status: DC | PRN

## 2020-06-14 MED ORDER — PREDNISONE 10 MG (21) PO TBPK: ORAL_TABLET | ORAL | 0 refills | Status: DC

## 2020-06-14 MED ORDER — LORAZEPAM 1 MG PO TABS: 1.0000 mg | ORAL_TABLET | Freq: Three times a day (TID) | ORAL | 5 refills | Status: DC | PRN

## 2020-06-14 MED ORDER — MELOXICAM 15 MG PO TABS: 15.0000 mg | ORAL_TABLET | Freq: Every day | ORAL | 1 refills | Status: DC

## 2020-06-14 NOTE — Progress Notes (Signed)
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, a 54 y.o. 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 Karter was last seen on 02/02/2020.  He notes that he has no major complaints and that he has been feeling pretty well.   He states that he has been eating a lot of fried foods recently, but that he has been working more frequently. He is currently not on medication for his cholesterol.   He continues to have problems with his left shoulder.  He was given a prescription for prednisone which appeared to help.  Unfortunately, fell off his riding lawnmower and pain started again.  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 It hurts worse at night. He said that he has to wear the right socks at night otherwise it keeps him awake and it drives him crazy.    REVIEW OF SYSTEMS:  Review of Systems  Constitutional: Negative.   HENT:  Negative.   Eyes: Negative.   Respiratory: Negative.   Cardiovascular: Negative.   Gastrointestinal: Negative.   Endocrine: Negative.   Genitourinary: Negative.    Musculoskeletal: Positive for arthralgias and myalgias.       Left shoulder pain  Skin: Negative.   Neurological: Positive for numbness (toes, bilateral).  Hematological: Negative.   Psychiatric/Behavioral: Negative.   All other systems reviewed and are negative.   PAST MEDICAL/SURGICAL HISTORY:  Past Medical History:  Diagnosis Date  . Anxiety   . Leukemia ([place])   . Thyroid disease    History reviewed. No pertinent surgical history.  SOCIAL HISTORY:  Social History   Socioeconomic History  . Marital status: Married    Spouse name: Leeroy Lovings  .  Number of children: 1  . Years of education: Not on file  . Highest education level: High school graduate  Occupational History  . 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 Smoking status: Never Smoker  . Smokeless tobacco: Never Used  Vaping Use  . Vaping Use: Never used  Substance and Sexual Activity  . Alcohol use: Yes    Alcohol/week: 21.0 standard drinks    Types: 21 Cans of beer per week  . Drug use: Not Currently  . Sexual activity: Yes  Other Topics Concern  . Not on file  Social History Narrative  . Not on file   Social Determinants of Health   Financial Resource Strain: Low Risk   . Difficulty of Paying Living Expenses: Not hard at all  Food Insecurity: No Food Insecurity  . Worried About Charity fundraiser in the Last Year: Never true  . Ran Out of Food in the Last Year: Never true  Transportation Needs: No Transportation Needs  . Lack of Transportation (Medical): No  . Lack of Transportation (Non-Medical): No  Physical Activity: Sufficiently Active  . Days of Exercise per Week: 5 days  . Minutes of Exercise per Session: 150+ min  Stress: No Stress Concern Present  . Feeling of Stress : Only a little  Social Connections: Unknown  . Frequency of Communication with Friends and Family: Three times a week  . Frequency  of Social Gatherings with Friends and Family: Three times a week  . Attends Religious Services: Not on file  . Active Member of Clubs or Organizations: No  . Attends Archivist Meetings: Never  . Marital Status: Married  Human resources officer Violence: Not At Risk  . Fear of Current or Ex-Partner: No  . Emotionally Abused: No  . Physically Abused: No  . Sexually Abused: No    FAMILY HISTORY:  Family History  Problem Relation Age of Onset  . Cervical cancer Mother   . Stroke Father     CURRENT MEDICATIONS:  Current Outpatient Medications  Medication Sig Dispense Refill  . allopurinol (ZYLOPRIM) 300 MG tablet  Take 300 mg by mouth daily. (Patient not taking: Reported on 02/02/2020)    . gabapentin (NEURONTIN) 300 MG capsule Take 1 capsule (300 mg total) by mouth at bedtime. 30 capsule 1  . HYDROcodone-acetaminophen (NORCO/VICODIN) 5-325 MG tablet Take 1 tablet by mouth every 8 (eight) hours as needed. 60 tablet 0  . JANUVIA 100 MG tablet TAKE 1 TABLET BY MOUTH EVERY DAY 90 tablet 6  . levothyroxine (SYNTHROID) 150 MCG tablet TAKE 1 TABLET BY MOUTH DAILY BEFORE BREAKFAST 90 tablet 6  . LORazepam (ATIVAN) 1 MG tablet Take 1 tablet (1 mg total) by mouth 3 (three) times daily as needed for anxiety. 90 tablet 5  . TASIGNA 150 MG capsule TAKE 1 CAPSULE BY MOUTH DAILY 84 capsule 3   No current facility-administered medications for this visit.    ALLERGIES:  Allergies  Allergen Reactions  . Levaquin [Levofloxacin]   . Pecan Nut (Diagnostic)     PHYSICAL EXAM:  Performance status (ECOG): 0 - Asymptomatic  Vitals:   06/14/20 1153  BP: (!) 107/55  Pulse: (!) 117  Resp: 18  Temp: 97.6 F (36.4 C)  SpO2: 100%   Wt Readings from Last 3 Encounters:  06/14/20 240 lb 4.8 oz (109 kg)  02/02/20 (!) 234 lb 6.4 oz (106.3 kg)  09/29/19 231 lb (104.8 kg)   Physical Exam Vitals reviewed.  Constitutional:      Appearance: Normal appearance.  HENT:     Mouth/Throat:     Mouth: Mucous membranes are moist.  Eyes:     Pupils: Pupils are equal, round, and reactive to light.  Cardiovascular:     Rate and Rhythm: Normal rate and regular rhythm.     Pulses: Normal pulses.     Heart sounds: Normal heart sounds.  Pulmonary:     Effort: Pulmonary effort is normal.     Breath sounds: Normal breath sounds.  Abdominal:     Palpations: Abdomen is soft. There is no mass.     Tenderness: There is no abdominal tenderness.  Musculoskeletal:     Right lower leg: No edema.     Left lower leg: No edema.  Neurological:     Mental Status: He is alert and oriented to person, place, and time.  Psychiatric:         Mood and Affect: Mood normal.        Behavior: Behavior normal.     LABORATORY DATA:  I have reviewed the labs as listed.  CBC Latest Ref Rng & Units 06/14/2020 02/02/2020 09/29/2019  WBC 4.0 - 10.5 K/uL 4.3 5.8 3.6(L)  Hemoglobin 13.0 - 17.0 g/dL 12.4(L) 12.5(L) 12.6(L)  Hematocrit 39 - 52 % 37.7(L) 38.1(L) 38.5(L)  Platelets 150 - 400 K/uL 58(L) 56(L) 57(L)   CMP Latest Ref Rng & Units 06/14/2020 02/02/2020  09/29/2019  Glucose 70 - 99 mg/dL 169(H) 128(H) 125(H)  BUN 6 - 20 mg/dL 11 14 11   Creatinine 0.61 - 1.24 mg/dL 0.77 0.77 0.73  Sodium 135 - 145 mmol/L 135 136 139  Potassium 3.5 - 5.1 mmol/L 3.6 4.2 3.7  Chloride 98 - 111 mmol/L 104 104 105  CO2 22 - 32 mmol/L 24 23 25   Calcium 8.9 - 10.3 mg/dL 8.3(L) 8.9 9.0  Total Protein 6.5 - 8.1 g/dL 7.7 8.0 8.2(H)  Total Bilirubin 0.3 - 1.2 mg/dL 1.7(H) 1.2 1.2  Alkaline Phos 38 - 126 U/L 69 65 93  AST 15 - 41 U/L 115(H) 98(H) 147(H)  ALT 0 - 44 U/L 42 56(H) 60(H)      Component Value Date/Time   RBC 3.89 (L) 06/14/2020 1012   MCV 96.9 06/14/2020 1012   MCH 31.9 06/14/2020 1012   MCHC 32.9 06/14/2020 1012   RDW 16.0 (H) 06/14/2020 1012   LYMPHSABS 2.2 06/14/2020 1012   MONOABS 0.5 06/14/2020 1012   EOSABS 0.0 06/14/2020 1012   BASOSABS 0.1 06/14/2020 1012    DIAGNOSTIC IMAGING:  I have independently reviewed the scans and discussed with the patient. No results found.   ASSESSMENT:  1.  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 -Nilotinib 300 mg twice daily started on July 05, 2012, dose reduced to 150 mg daily Monday through Friday in April 2015. -BCR/ABL by PCR has been negative since April 2017.  2.  Thrombocytopenia:  -He has mild to moderate thrombocytopenia since the start of admit. -MRI of the abdomen on 05/20/2018 shows normal-sized spleen but cirrhotic liver configuration.   PLAN:  1.  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  Platelet count 58. -BCR/ABL by quantitative PCR is pending during dictation.  He has a musculoskeletal pains for which he takes hydrocodone.  This is from Nilotinib.  -Continue Nilotinib 150 mg daily Monday through Friday. -Return to clinic in 3 months with labs.  2.  Thrombocytopenia: -This is stable at 58.  No bleeding issues.  3.  Elevated LFTs: -This is from underlying cirrhosis as well as Nilotinib. -AST is better at 115 and ALT of 42.  Total bilirubin is 1.7.  4.  Hypothyroidism: -Continue Synthroid 150 mcg daily.  TSH today is 3.212.  5.  Hyperlipidemia: -He could not tolerate statins in the past. -His LDL is 95.  HDL is 32.  Total cholesterol is 166.  Triglycerides are 196. -I have recommended dietary modification and exercise.  6.  Diabetes: -Hemoglobin A1c today 6.3 and stable. -Continue Januvia.  7.  Left shoulder pain: -Secondary to possible injury. -Recommend x-ray of left shoulder. -Given prednisone has helped in the past, can try steroid taper again to see if this helps. -Patient could also try meloxicam. -If symptoms are persistent, would recommend orthopedic referral. -We will call patient with results from shoulder x-ray.  Disposition: -We will call patient with results from imaging and additional lab work. -Prescription printed for Ativan and pain medicine.  Patient needs to take this to a specific pharmacy in [place]. -RTC in 3 months with lab work and assessment.  Greater than 50% was spent in counseling and coordination of care with this patient including but not limited to discussion of the relevant topics above (See A&P) including, but not limited to diagnosis and management of acute and chronic medical conditions.   Orders placed this encounter:  No orders of the defined types were placed in this encounter.  Faythe Casa, NP 06/14/2020 4:54 PM  [place] [phone]

## 2020-06-14 NOTE — Progress Notes (Signed)
Pt takes Tasigna as prescribed with nausea as the only side effect.

## 2020-06-20 ENCOUNTER — Telehealth (HOSPITAL_COMMUNITY): Payer: Self-pay | Admitting: Surgery

## 2020-06-20 ENCOUNTER — Other Ambulatory Visit: Payer: Self-pay | Admitting: Oncology

## 2020-06-20 DIAGNOSIS — M25512 Pain in left shoulder: Secondary | ICD-10-CM

## 2020-06-20 NOTE — Telephone Encounter (Signed)
Pt's wife had called earlier today asking if the results of his recent x ray and lab work was back.    Faythe Casa, NP, was notified and placed a referral for an orthopedic consult.  Also, Anderson Malta stated that the x ray showed tendinopathy in his rotator cuff and the pt's labs were not back yet.  I called the pt's wife back and let her know that an orthopedic referral was placed for the pt in [place] due to the tendinopathy, and the pt asked if the referral could be sent somewhere in [place] where they live.    I notified Faythe Casa, NP, who stated that she could send her notes to a provider in [place] if the pt had one in mind.  I called the pt's wife back to see if they had a specific orthopedist in mind and she said she would talk to her husband and call our office back.

## 2020-06-20 NOTE — Progress Notes (Signed)
Re: Shoulder Pain   Reviewed results from imaging from left shoulder x-ray.   IMPRESSION: 1. 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 2. Question of prior coracoclavicular ligament injury. 3. Correlate with any signs of shoulder instability.  Referral placed for Orthopedic's.   Patient would like a referral to Ortho in [place], [place], NP 06/20/2020 10:52 AM

## 2020-06-23 LAB — BCR-ABL1, CML/ALL, PCR, QUANT: Interpretation (BCRAL):: NEGATIVE

## 2020-07-06 ENCOUNTER — Other Ambulatory Visit (HOSPITAL_COMMUNITY): Payer: Self-pay

## 2020-07-06 DIAGNOSIS — C9210 Chronic myeloid leukemia, BCR/ABL-positive, not having achieved remission: Secondary | ICD-10-CM

## 2020-07-06 MED ORDER — GABAPENTIN 300 MG PO CAPS: 300.0000 mg | ORAL_CAPSULE | Freq: Every day | ORAL | 1 refills | Status: DC

## 2020-07-13 ENCOUNTER — Encounter: Payer: Self-pay | Admitting: Orthopedic Surgery

## 2020-08-03 ENCOUNTER — Other Ambulatory Visit (HOSPITAL_COMMUNITY): Payer: Self-pay | Admitting: Hematology

## 2020-08-03 ENCOUNTER — Other Ambulatory Visit (HOSPITAL_COMMUNITY): Payer: Self-pay

## 2020-08-03 DIAGNOSIS — C9210 Chronic myeloid leukemia, BCR/ABL-positive, not having achieved remission: Secondary | ICD-10-CM

## 2020-08-03 MED ORDER — HYDROCODONE-ACETAMINOPHEN 5-325 MG PO TABS: 1.0000 | ORAL_TABLET | Freq: Three times a day (TID) | ORAL | 0 refills | Status: DC | PRN

## 2020-08-03 MED ORDER — LORAZEPAM 1 MG PO TABS: 1.0000 mg | ORAL_TABLET | Freq: Three times a day (TID) | ORAL | 5 refills | Status: DC | PRN

## 2020-08-08 ENCOUNTER — Other Ambulatory Visit (HOSPITAL_COMMUNITY): Payer: Self-pay

## 2020-08-08 ENCOUNTER — Other Ambulatory Visit (HOSPITAL_COMMUNITY): Payer: Self-pay | Admitting: Hematology

## 2020-08-08 DIAGNOSIS — C9210 Chronic myeloid leukemia, BCR/ABL-positive, not having achieved remission: Secondary | ICD-10-CM

## 2020-08-08 MED ORDER — HYDROCODONE-ACETAMINOPHEN 5-325 MG PO TABS: 1.0000 | ORAL_TABLET | Freq: Three times a day (TID) | ORAL | 0 refills | Status: DC | PRN

## 2020-08-08 MED ORDER — LORAZEPAM 1 MG PO TABS: 1.0000 mg | ORAL_TABLET | Freq: Three times a day (TID) | ORAL | 5 refills | Status: DC | PRN

## 2020-09-05 ENCOUNTER — Other Ambulatory Visit (HOSPITAL_COMMUNITY): Payer: Self-pay

## 2020-09-05 ENCOUNTER — Other Ambulatory Visit (HOSPITAL_COMMUNITY): Payer: Self-pay | Admitting: Hematology

## 2020-09-05 DIAGNOSIS — C9210 Chronic myeloid leukemia, BCR/ABL-positive, not having achieved remission: Secondary | ICD-10-CM

## 2020-09-06 MED ORDER — HYDROCODONE-ACETAMINOPHEN 5-325 MG PO TABS: 1.0000 | ORAL_TABLET | Freq: Three times a day (TID) | ORAL | 0 refills | Status: DC | PRN

## 2020-09-06 MED ORDER — LORAZEPAM 1 MG PO TABS: 1.0000 mg | ORAL_TABLET | Freq: Three times a day (TID) | ORAL | 5 refills | Status: DC | PRN

## 2020-09-12 ENCOUNTER — Ambulatory Visit (HOSPITAL_COMMUNITY): Payer: 59 | Admitting: Hematology

## 2020-09-12 ENCOUNTER — Other Ambulatory Visit (HOSPITAL_COMMUNITY): Payer: 59

## 2020-09-20 ENCOUNTER — Inpatient Hospital Stay (HOSPITAL_COMMUNITY): Payer: 59 | Attending: Hematology

## 2020-09-20 ENCOUNTER — Other Ambulatory Visit (HOSPITAL_COMMUNITY): Payer: Self-pay

## 2020-09-20 ENCOUNTER — Other Ambulatory Visit: Payer: Self-pay

## 2020-09-20 ENCOUNTER — Telehealth (HOSPITAL_COMMUNITY): Payer: Self-pay | Admitting: Pharmacy Technician

## 2020-09-20 ENCOUNTER — Inpatient Hospital Stay (HOSPITAL_COMMUNITY): Payer: 59 | Admitting: Oncology

## 2020-09-20 VITALS — BP 148/86 | HR 97 | Temp 97.9°F | Resp 18

## 2020-09-20 DIAGNOSIS — Z7984 Long term (current) use of oral hypoglycemic drugs: Secondary | ICD-10-CM | POA: Diagnosis not present

## 2020-09-20 DIAGNOSIS — E039 Hypothyroidism, unspecified: Secondary | ICD-10-CM | POA: Diagnosis not present

## 2020-09-20 DIAGNOSIS — E119 Type 2 diabetes mellitus without complications: Secondary | ICD-10-CM | POA: Insufficient documentation

## 2020-09-20 DIAGNOSIS — D696 Thrombocytopenia, unspecified: Secondary | ICD-10-CM | POA: Insufficient documentation

## 2020-09-20 DIAGNOSIS — C9210 Chronic myeloid leukemia, BCR/ABL-positive, not having achieved remission: Secondary | ICD-10-CM

## 2020-09-20 DIAGNOSIS — Z79899 Other long term (current) drug therapy: Secondary | ICD-10-CM | POA: Diagnosis not present

## 2020-09-20 DIAGNOSIS — E785 Hyperlipidemia, unspecified: Secondary | ICD-10-CM | POA: Insufficient documentation

## 2020-09-20 DIAGNOSIS — M25512 Pain in left shoulder: Secondary | ICD-10-CM | POA: Diagnosis not present

## 2020-09-20 LAB — COMPREHENSIVE METABOLIC PANEL
ALT: 53 U/L — ABNORMAL HIGH (ref 0–44)
AST: 151 U/L — ABNORMAL HIGH (ref 15–41)
Albumin: 3.2 g/dL — ABNORMAL LOW (ref 3.5–5.0)
Alkaline Phosphatase: 100 U/L (ref 38–126)
Anion gap: 10 (ref 5–15)
BUN: 9 mg/dL (ref 6–20)
CO2: 22 mmol/L (ref 22–32)
Calcium: 8.4 mg/dL — ABNORMAL LOW (ref 8.9–10.3)
Chloride: 106 mmol/L (ref 98–111)
Creatinine, Ser: 0.76 mg/dL (ref 0.61–1.24)
GFR, Estimated: >60 mL/min (ref >60)
Glucose, Bld: 136 mg/dL — ABNORMAL HIGH (ref 70–99)
Potassium: 3.6 mmol/L (ref 3.5–5.1)
Sodium: 138 mmol/L (ref 135–145)
Total Bilirubin: 1.3 mg/dL — ABNORMAL HIGH (ref 0.3–1.2)
Total Protein: 7.8 g/dL (ref 6.5–8.1)

## 2020-09-20 LAB — LIPID PANEL
Cholesterol: 156 mg/dL (ref 0–200)
HDL: 30 mg/dL — ABNORMAL LOW (ref >40)
LDL Cholesterol: 100 mg/dL — ABNORMAL HIGH (ref 0–99)
Total CHOL/HDL Ratio: 5.2 RATIO
Triglycerides: 130 mg/dL (ref <150)
VLDL: 26 mg/dL (ref 0–40)

## 2020-09-20 LAB — TSH: TSH: 11.571 u[IU]/mL — ABNORMAL HIGH (ref 0.350–4.500)

## 2020-09-20 LAB — LACTATE DEHYDROGENASE: LDH: 262 U/L — ABNORMAL HIGH (ref 98–192)

## 2020-09-20 LAB — CBC WITH DIFFERENTIAL/PLATELET
Abs Immature Granulocytes: 0 10*3/uL (ref 0.00–0.07)
Basophils Absolute: 0.1 10*3/uL (ref 0.0–0.1)
Basophils Relative: 3 %
Eosinophils Absolute: 0 10*3/uL (ref 0.0–0.5)
Eosinophils Relative: 0 %
HCT: 34.8 % — ABNORMAL LOW (ref 39.0–52.0)
Hemoglobin: 11.4 g/dL — ABNORMAL LOW (ref 13.0–17.0)
Immature Granulocytes: 0 %
Lymphocytes Relative: 46 %
Lymphs Abs: 1.7 10*3/uL (ref 0.7–4.0)
MCH: 30.8 pg (ref 26.0–34.0)
MCHC: 32.8 g/dL (ref 30.0–36.0)
MCV: 94.1 fL (ref 80.0–100.0)
Monocytes Absolute: 0.4 10*3/uL (ref 0.1–1.0)
Monocytes Relative: 10 %
Neutro Abs: 1.6 10*3/uL — ABNORMAL LOW (ref 1.7–7.7)
Neutrophils Relative %: 41 %
Platelets: 52 10*3/uL — ABNORMAL LOW (ref 150–400)
RBC: 3.7 MIL/uL — ABNORMAL LOW (ref 4.22–5.81)
RDW: 16.6 % — ABNORMAL HIGH (ref 11.5–15.5)
WBC: 3.8 10*3/uL — ABNORMAL LOW (ref 4.0–10.5)
nRBC: 0 % (ref 0.0–0.2)

## 2020-09-20 LAB — HEMOGLOBIN A1C
Hgb A1c MFr Bld: 6.4 % — ABNORMAL HIGH (ref 4.8–5.6)
Mean Plasma Glucose: 136.98 mg/dL

## 2020-09-20 MED ORDER — GABAPENTIN 300 MG PO CAPS: 300.0000 mg | ORAL_CAPSULE | Freq: Every day | ORAL | 1 refills | Status: DC

## 2020-09-20 NOTE — Telephone Encounter (Signed)
Oral Oncology Patient Advocate Encounter  Prior Authorization for Samuel Gonzales has been approved.    PA# [id number] Effective dates: 09/20/20 through 09/20/21  Oral Oncology Clinic will continue to follow.   [place] Patient [name] Phone [phone] Fax [phone] 09/20/2020 4:53 PM

## 2020-09-20 NOTE — Telephone Encounter (Signed)
Oral Oncology Patient Advocate Encounter  Received notification from [facility]) that prior authorization for Tasigna is required.  PA submitted on CoverMyMeds Key [id number] Status is pending  Oral Oncology Clinic will continue to follow.  [place] Patient [place] Phone [phone] Fax [phone] 09/20/2020 10:52 AM

## 2020-09-20 NOTE — Progress Notes (Signed)
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, a 55 y.o. 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 Samuel Gonzales was last seen on 06/20/2020.  Samuel Gonzales was evaluated by Ortho in [facility].  Symptoms improved on their own.  Samuel Gonzales no longer complains of pain in his left shoulder.  The Ortho physician told them to call him back if symptoms returned.  Admits to bilateral numbness and tingling in his feet.  Samuel Gonzales was prescribed gabapentin but is not taking it because it makes him feel sleepy.  Samuel Gonzales notes that Samuel Gonzales has no major complaints and that Samuel Gonzales has been feeling pretty well.    REVIEW OF SYSTEMS:  Review of Systems  Constitutional: Negative.   HENT:  Negative.   Eyes: Negative.   Respiratory: Negative.   Cardiovascular: Negative.   Gastrointestinal: Negative.   Endocrine: Negative.   Genitourinary: Negative.    Musculoskeletal: Positive for arthralgias and myalgias.  Skin: Negative.   Neurological: Positive for numbness (toes, bilateral).  Hematological: Negative.   Psychiatric/Behavioral: Negative.   All other systems reviewed and are negative.   PAST MEDICAL/SURGICAL HISTORY:  Past Medical History:  Diagnosis Date  . Anxiety   . Leukemia ([place])   . Thyroid disease    No past surgical history on file.  SOCIAL HISTORY:  Social History   Socioeconomic History  . Marital status: Married    Spouse name: Elvin Mccartin  . Number of children: 1  . Years of education: Not on file  . Highest education level: High school graduate  Occupational History  . 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 Smoking status: Never Smoker  .  Smokeless tobacco: Never Used  Vaping Use  . Vaping Use: Never used  Substance and Sexual Activity  . Alcohol use: Yes    Alcohol/week: 21.0 standard drinks    Types: 21 Cans of beer per week  . Drug use: Not Currently  . Sexual activity: Yes  Other Topics Concern  . Not on file  Social History Narrative  . Not on file   Social Determinants of Health   Financial Resource Strain: Low Risk   . Difficulty of Paying Living Expenses: Not hard at all  Food Insecurity: No Food Insecurity  . Worried About Charity fundraiser in the Last Year: Never true  . Ran Out of Food in the Last Year: Never true  Transportation Needs: No Transportation Needs  . Lack of Transportation (Medical): No  . Lack of Transportation (Non-Medical): No  Physical Activity: Sufficiently Active  . Days of Exercise per Week: 5 days  . Minutes of Exercise per Session: 150+ min  Stress: No Stress Concern Present  . Feeling of Stress : Only a little  Social Connections: Unknown  . Frequency of Communication with Friends and Family: Three times a week  . Frequency of Social Gatherings with Friends and Family: Three times a week  . Attends Religious Services: Not on file  . Active Member of Clubs or Organizations: No  . Attends Archivist Meetings: Never  . Marital Status: Married  Human resources officer Violence: Not At Risk  .  Fear of Current or Ex-Partner: No  . Emotionally Abused: No  . Physically Abused: No  . Sexually Abused: No    FAMILY HISTORY:  Family History  Problem Relation Age of Onset  . Cervical cancer Mother   . Stroke Father     CURRENT MEDICATIONS:  Current Outpatient Medications  Medication Sig Dispense Refill  . allopurinol (ZYLOPRIM) 300 MG tablet Take 300 mg by mouth daily. (Patient not taking: Reported on 02/02/2020)    . gabapentin (NEURONTIN) 300 MG capsule Take 1 capsule (300 mg total) by mouth at bedtime. 30 capsule 1  . HYDROcodone-acetaminophen (NORCO/VICODIN) 5-325 MG  tablet Take 1 tablet by mouth every 8 (eight) hours as needed. 60 tablet 0  . HYDROcodone-acetaminophen (NORCO/VICODIN) 5-325 MG tablet Take 1 tablet by mouth every 8 (eight) hours as needed. 60 tablet 0  . JANUVIA 100 MG tablet TAKE 1 TABLET BY MOUTH EVERY DAY 90 tablet 6  . levothyroxine (SYNTHROID) 150 MCG tablet TAKE 1 TABLET BY MOUTH DAILY BEFORE BREAKFAST 90 tablet 6  . LORazepam (ATIVAN) 1 MG tablet Take 1 tablet (1 mg total) by mouth 3 (three) times daily as needed for anxiety. 90 tablet 5  . meloxicam (MOBIC) 15 MG tablet Take 1 tablet (15 mg total) by mouth daily. 30 tablet 1  . predniSONE (STERAPRED UNI-PAK 21 TAB) 10 MG (21) TBPK tablet Take as directed. 21 tablet 0  . TASIGNA 150 MG capsule TAKE 1 CAPSULE BY MOUTH DAILY 84 capsule 0   No current facility-administered medications for this visit.    ALLERGIES:  Allergies  Allergen Reactions  . Levaquin [Levofloxacin]   . Pecan Nut (Diagnostic)     PHYSICAL EXAM:  Performance status (ECOG): 0 - Asymptomatic  There were no vitals filed for this visit. Wt Readings from Last 3 Encounters:  06/14/20 240 lb 4.8 oz (109 kg)  02/02/20 (!) 234 lb 6.4 oz (106.3 kg)  09/29/19 231 lb (104.8 kg)   Physical Exam Vitals reviewed.  Constitutional:      Appearance: Normal appearance.  HENT:     Mouth/Throat:     Mouth: Mucous membranes are moist.  Eyes:     Pupils: Pupils are equal, round, and reactive to light.  Cardiovascular:     Rate and Rhythm: Normal rate and regular rhythm.     Pulses: Normal pulses.     Heart sounds: Normal heart sounds.  Pulmonary:     Effort: Pulmonary effort is normal.     Breath sounds: Normal breath sounds.  Abdominal:     Palpations: Abdomen is soft. There is no mass.     Tenderness: There is no abdominal tenderness.  Musculoskeletal:     Right lower leg: No edema.     Left lower leg: No edema.  Neurological:     Mental Status: Samuel Gonzales is alert and oriented to person, place, and time.   Psychiatric:        Mood and Affect: Mood normal.        Behavior: Behavior normal.     LABORATORY DATA:  I have reviewed the labs as listed.  CBC Latest Ref Rng & Units 06/14/2020 02/02/2020 09/29/2019  WBC 4.0 - 10.5 K/uL 4.3 5.8 3.6(L)  Hemoglobin 13.0 - 17.0 g/dL 12.4(L) 12.5(L) 12.6(L)  Hematocrit 39.0 - 52.0 % 37.7(L) 38.1(L) 38.5(L)  Platelets 150 - 400 K/uL 58(L) 56(L) 57(L)   CMP Latest Ref Rng & Units 06/14/2020 02/02/2020 09/29/2019  Glucose 70 - 99 mg/dL 169(H) 128(H) 125(H)  BUN 6 - 20 mg/dL 11 14 11   Creatinine 0.61 - 1.24 mg/dL 0.77 0.77 0.73  Sodium 135 - 145 mmol/L 135 136 139  Potassium 3.5 - 5.1 mmol/L 3.6 4.2 3.7  Chloride 98 - 111 mmol/L 104 104 105  CO2 22 - 32 mmol/L 24 23 25   Calcium 8.9 - 10.3 mg/dL 8.3(L) 8.9 9.0  Total Protein 6.5 - 8.1 g/dL 7.7 8.0 8.2(H)  Total Bilirubin 0.3 - 1.2 mg/dL 1.7(H) 1.2 1.2  Alkaline Phos 38 - 126 U/L 69 65 93  AST 15 - 41 U/L 115(H) 98(H) 147(H)  ALT 0 - 44 U/L 42 56(H) 60(H)      Component Value Date/Time   RBC 3.89 (L) 06/14/2020 1012   MCV 96.9 06/14/2020 1012   MCH 31.9 06/14/2020 1012   MCHC 32.9 06/14/2020 1012   RDW 16.0 (H) 06/14/2020 1012   LYMPHSABS 2.2 06/14/2020 1012   MONOABS 0.5 06/14/2020 1012   EOSABS 0.0 06/14/2020 1012   BASOSABS 0.1 06/14/2020 1012    DIAGNOSTIC IMAGING:  I have independently reviewed the scans and discussed with the patient. No results found.   ASSESSMENT:  1.  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 -Nilotinib 300 mg twice daily started on July 05, 2012, dose reduced to 150 mg daily Monday through Friday in April 2015. -BCR/ABL by PCR has been negative since April 2017.  2.  Thrombocytopenia:  -Samuel Gonzales has mild to moderate thrombocytopenia since the start of admit. -MRI of the abdomen on 05/20/2018 shows normal-sized spleen but cirrhotic liver configuration.   PLAN:  1.  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  Platelet count 52. -BCR/ABL by quantitative PCR is pending during dictation.  Samuel Gonzales has a musculoskeletal pains for which Samuel Gonzales takes hydrocodone.  This is from Nilotinib.  -Continue Nilotinib 150 mg daily Monday through Friday. -Return to clinic in 3 months with labs.  2.  Thrombocytopenia: -This is stable at 52.  No bleeding issues.  3.  Elevated LFTs: -This is from underlying cirrhosis as well as Nilotinib. -AST is 151 and ALT of 53.  Total bilirubin is 1.3.  4.  Hypothyroidism: -Continue Synthroid 150 mcg daily.  TSH today is 11.571 -Spoke with Dr. Delton Coombes who does not feel that making adjustments to his Synthroid is appropriate at this time.  We will recheck TSH at next visit.  5.  Hyperlipidemia: -Samuel Gonzales could not tolerate statins in the past. -His LDL is 100.  HDL is 30.  Total cholesterol is 156.  Triglycerides are 130. -I have recommended dietary modification and exercise.  6.  Diabetes: -Hemoglobin A1c pending. -Continue Januvia.  7.  Left shoulder pain: -Has improved without intervention. -X-ray of left shoulder on 06/14/2020 showed calcific tendinopathy of the rotator cuff and degenerative changes around the shoulder.  Questions of prior clavicular ligament injury.  Correlate with signs of shoulder instability. -Samuel Gonzales was referred to orthopedic in [facility].  Disposition: -RTC in 3 months with lab work (CBC, CMP, lipid panel, TSH, BCR-ABL 1, hemoglobin A1c) and assessment.  Greater than 50% was spent in counseling and coordination of care with this patient including but not limited to discussion of the relevant topics above (See A&P) including, but not limited to diagnosis and management of acute and chronic medical conditions.   Orders placed this encounter:  Orders Placed This Encounter  Procedures  . CBC with Differential/Platelet  . Comprehensive  metabolic panel  . Lactate dehydrogenase  . Lipid panel  .  BCR-ABL1, CML/ALL, PCR, QUANT  . TSH  . Hemoglobin A1c   Faythe Casa, NP 09/20/2020 9:28 AM  [place] [phone]

## 2020-09-25 LAB — BCR-ABL1, CML/ALL, PCR, QUANT: Interpretation (BCRAL):: NEGATIVE

## 2020-10-03 ENCOUNTER — Other Ambulatory Visit (HOSPITAL_COMMUNITY): Payer: Self-pay | Admitting: Surgery

## 2020-10-03 ENCOUNTER — Telehealth (HOSPITAL_COMMUNITY): Payer: Self-pay | Admitting: Surgery

## 2020-10-03 DIAGNOSIS — C9210 Chronic myeloid leukemia, BCR/ABL-positive, not having achieved remission: Secondary | ICD-10-CM

## 2020-10-03 MED ORDER — HYDROCODONE-ACETAMINOPHEN 5-325 MG PO TABS: 1.0000 | ORAL_TABLET | Freq: Three times a day (TID) | ORAL | 0 refills | Status: DC | PRN

## 2020-10-03 NOTE — Telephone Encounter (Signed)
Pt's wife had called requesting refills on the pt's hydrocodone and ativan.  She also asked about the pt's lab results from 2 weeks ago.  I notified Dr. Delton Coombes and he left a voicemail with the results of the BCR-ABL1 for the pt's wife.  Refill for hydrocodone sent in and pt can refill ativan in 2 days.  Pt's wife verbalized understanding.

## 2020-11-02 ENCOUNTER — Other Ambulatory Visit (HOSPITAL_COMMUNITY): Payer: Self-pay | Admitting: Hematology

## 2020-11-02 DIAGNOSIS — C9210 Chronic myeloid leukemia, BCR/ABL-positive, not having achieved remission: Secondary | ICD-10-CM

## 2020-11-03 ENCOUNTER — Other Ambulatory Visit (HOSPITAL_COMMUNITY): Payer: Self-pay

## 2020-11-03 DIAGNOSIS — C9210 Chronic myeloid leukemia, BCR/ABL-positive, not having achieved remission: Secondary | ICD-10-CM

## 2020-11-03 MED ORDER — HYDROCODONE-ACETAMINOPHEN 5-325 MG PO TABS
1.0000 | ORAL_TABLET | Freq: Three times a day (TID) | ORAL | 0 refills | Status: DC | PRN
Start: 2020-11-03 — End: 2020-12-07

## 2020-12-07 ENCOUNTER — Other Ambulatory Visit (HOSPITAL_COMMUNITY): Payer: Self-pay

## 2020-12-07 DIAGNOSIS — C9210 Chronic myeloid leukemia, BCR/ABL-positive, not having achieved remission: Secondary | ICD-10-CM

## 2020-12-07 MED ORDER — HYDROCODONE-ACETAMINOPHEN 5-325 MG PO TABS: 1.0000 | ORAL_TABLET | Freq: Three times a day (TID) | ORAL | 0 refills | Status: DC | PRN

## 2020-12-07 MED ORDER — LORAZEPAM 1 MG PO TABS: 1.0000 mg | ORAL_TABLET | Freq: Three times a day (TID) | ORAL | 5 refills | Status: DC | PRN

## 2020-12-09 ENCOUNTER — Other Ambulatory Visit (HOSPITAL_COMMUNITY): Payer: Self-pay | Admitting: *Deleted

## 2020-12-09 DIAGNOSIS — C9210 Chronic myeloid leukemia, BCR/ABL-positive, not having achieved remission: Secondary | ICD-10-CM

## 2020-12-09 MED ORDER — HYDROCODONE-ACETAMINOPHEN 5-325 MG PO TABS
1.0000 | ORAL_TABLET | Freq: Three times a day (TID) | ORAL | 0 refills | Status: DC | PRN
Start: 2020-12-09 — End: 2021-01-04

## 2021-01-04 ENCOUNTER — Other Ambulatory Visit (HOSPITAL_COMMUNITY): Payer: Self-pay | Admitting: *Deleted

## 2021-01-04 DIAGNOSIS — C9210 Chronic myeloid leukemia, BCR/ABL-positive, not having achieved remission: Secondary | ICD-10-CM

## 2021-01-04 MED ORDER — HYDROCODONE-ACETAMINOPHEN 5-325 MG PO TABS
1.0000 | ORAL_TABLET | Freq: Three times a day (TID) | ORAL | 0 refills | Status: DC | PRN
Start: 2021-01-04 — End: 2021-02-01

## 2021-01-11 ENCOUNTER — Other Ambulatory Visit (HOSPITAL_COMMUNITY): Payer: 59

## 2021-01-17 ENCOUNTER — Ambulatory Visit (HOSPITAL_COMMUNITY): Payer: 59 | Admitting: Physician Assistant

## 2021-01-18 ENCOUNTER — Ambulatory Visit (HOSPITAL_COMMUNITY): Payer: 59 | Admitting: Hematology

## 2021-01-24 ENCOUNTER — Inpatient Hospital Stay (HOSPITAL_COMMUNITY): Payer: 59 | Attending: Hematology

## 2021-01-24 ENCOUNTER — Other Ambulatory Visit: Payer: Self-pay

## 2021-01-24 ENCOUNTER — Inpatient Hospital Stay (HOSPITAL_COMMUNITY): Payer: 59 | Admitting: Hematology and Oncology

## 2021-01-24 VITALS — BP 151/83 | HR 105 | Temp 97.0°F | Resp 18 | Wt 232.8 lb

## 2021-01-24 DIAGNOSIS — E785 Hyperlipidemia, unspecified: Secondary | ICD-10-CM | POA: Insufficient documentation

## 2021-01-24 DIAGNOSIS — D696 Thrombocytopenia, unspecified: Secondary | ICD-10-CM | POA: Diagnosis not present

## 2021-01-24 DIAGNOSIS — E039 Hypothyroidism, unspecified: Secondary | ICD-10-CM | POA: Diagnosis not present

## 2021-01-24 DIAGNOSIS — C9210 Chronic myeloid leukemia, BCR/ABL-positive, not having achieved remission: Secondary | ICD-10-CM

## 2021-01-24 DIAGNOSIS — Z7984 Long term (current) use of oral hypoglycemic drugs: Secondary | ICD-10-CM | POA: Diagnosis not present

## 2021-01-24 DIAGNOSIS — Z79899 Other long term (current) drug therapy: Secondary | ICD-10-CM | POA: Diagnosis not present

## 2021-01-24 DIAGNOSIS — R945 Abnormal results of liver function studies: Secondary | ICD-10-CM | POA: Insufficient documentation

## 2021-01-24 DIAGNOSIS — E119 Type 2 diabetes mellitus without complications: Secondary | ICD-10-CM | POA: Insufficient documentation

## 2021-01-24 LAB — LIPID PANEL
Cholesterol: 155 mg/dL (ref 0–200)
HDL: 46 mg/dL (ref >40)
LDL Cholesterol: 98 mg/dL (ref 0–99)
Total CHOL/HDL Ratio: 3.4 RATIO
Triglycerides: 54 mg/dL (ref <150)
VLDL: 11 mg/dL (ref 0–40)

## 2021-01-24 LAB — COMPREHENSIVE METABOLIC PANEL
ALT: 39 U/L (ref 0–44)
AST: 113 U/L — ABNORMAL HIGH (ref 15–41)
Albumin: 3.5 g/dL (ref 3.5–5.0)
Alkaline Phosphatase: 81 U/L (ref 38–126)
Anion gap: 8 (ref 5–15)
BUN: 14 mg/dL (ref 6–20)
CO2: 23 mmol/L (ref 22–32)
Calcium: 8.4 mg/dL — ABNORMAL LOW (ref 8.9–10.3)
Chloride: 103 mmol/L (ref 98–111)
Creatinine, Ser: 0.8 mg/dL (ref 0.61–1.24)
GFR, Estimated: >60 mL/min (ref >60)
Glucose, Bld: 121 mg/dL — ABNORMAL HIGH (ref 70–99)
Potassium: 4.1 mmol/L (ref 3.5–5.1)
Sodium: 134 mmol/L — ABNORMAL LOW (ref 135–145)
Total Bilirubin: 1.4 mg/dL — ABNORMAL HIGH (ref 0.3–1.2)
Total Protein: 8.3 g/dL — ABNORMAL HIGH (ref 6.5–8.1)

## 2021-01-24 LAB — CBC WITH DIFFERENTIAL/PLATELET
Abs Immature Granulocytes: 0.02 10*3/uL (ref 0.00–0.07)
Basophils Absolute: 0.1 10*3/uL (ref 0.0–0.1)
Basophils Relative: 2 %
Eosinophils Absolute: 0 10*3/uL (ref 0.0–0.5)
Eosinophils Relative: 0 %
HCT: 35.8 % — ABNORMAL LOW (ref 39.0–52.0)
Hemoglobin: 11.7 g/dL — ABNORMAL LOW (ref 13.0–17.0)
Immature Granulocytes: 0 %
Lymphocytes Relative: 38 %
Lymphs Abs: 1.8 10*3/uL (ref 0.7–4.0)
MCH: 31.7 pg (ref 26.0–34.0)
MCHC: 32.7 g/dL (ref 30.0–36.0)
MCV: 97 fL (ref 80.0–100.0)
Monocytes Absolute: 0.7 10*3/uL (ref 0.1–1.0)
Monocytes Relative: 15 %
Neutro Abs: 2.1 10*3/uL (ref 1.7–7.7)
Neutrophils Relative %: 45 %
Platelets: 51 10*3/uL — ABNORMAL LOW (ref 150–400)
RBC: 3.69 MIL/uL — ABNORMAL LOW (ref 4.22–5.81)
RDW: 15.1 % (ref 11.5–15.5)
WBC: 4.8 10*3/uL (ref 4.0–10.5)
nRBC: 0 % (ref 0.0–0.2)

## 2021-01-24 LAB — HEMOGLOBIN A1C
Hgb A1c MFr Bld: 6 % — ABNORMAL HIGH (ref 4.8–5.6)
Mean Plasma Glucose: 125.5 mg/dL

## 2021-01-24 LAB — LACTATE DEHYDROGENASE: LDH: 220 U/L — ABNORMAL HIGH (ref 98–192)

## 2021-01-24 LAB — TSH: TSH: 10.203 u[IU]/mL — ABNORMAL HIGH (ref 0.350–4.500)

## 2021-01-24 NOTE — Progress Notes (Signed)
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 Samuel Gonzales was last seen on 09/20/2020.  On exam today Samuel Gonzales notes that he feels well.  He is currently taking his the lot neb on Mondays through Fridays and taking Saturday and Sunday off.  He notes that this is helped reduce the number of side effects that he is having from the medication.  He is also taking Ativan which helps with the nausea and joint pain.  He does occasionally have some numbness of his toes.  He notes that he is not having any stomach upset at this time or chest pain.  He notes that his energy feels "depleted" but that he is getting older.  He otherwise denies any fevers, chills, sweats, nausea, vomiting or diarrhea.  A full 10 point ROS is listed below.   REVIEW OF SYSTEMS:  Review of Systems  Constitutional: Negative.   HENT:  Negative.    Eyes: Negative.   Respiratory: Negative.    Cardiovascular: Negative.   Gastrointestinal: Negative.   Endocrine: Negative.   Genitourinary: Negative.    Musculoskeletal:  Positive for arthralgias and myalgias.  Skin: Negative.   Neurological:  Positive for numbness (toes, bilateral).  Hematological: Negative.   Psychiatric/Behavioral: Negative.    All other systems reviewed and are negative.  PAST MEDICAL/SURGICAL HISTORY:  Past Medical History:  Diagnosis Date   Anxiety    Leukemia ([place])    Thyroid disease    No past surgical history on file.  SOCIAL HISTORY:  Social History   Socioeconomic History   Marital status: Married    Spouse name: Samuel Gonzales   Number of children: 1   Years of  education: Not on file   Highest education level: High school graduate  Occupational History   Occupation: [employer]     CommentLoss adjuster, chartered  Tobacco Use   Smoking status: Never   Smokeless tobacco: Never  Vaping Use   Vaping Use: Never used  Substance and Sexual Activity   Alcohol use: Yes    Alcohol/week: 21.0 standard drinks    Types: 21 Cans of beer per week   Drug use: Not Currently   Sexual activity: Yes  Other Topics Concern   Not on file  Social History Narrative   Not on file   Social Determinants of Health   Financial Resource Strain: Low Risk    Difficulty of Paying Living Expenses: Not hard at all  Food Insecurity: No Food Insecurity   Worried About Charity fundraiser in the Last Year: Never true   [place] in the Last Year: Never true  Transportation Needs: No Transportation Needs   Lack of Transportation (Medical): No   Lack of Transportation (Non-Medical): No  Physical Activity: Sufficiently Active   Days of Exercise per Week: 5 days   Minutes of Exercise per Session: 150+ min  Stress: No Stress Concern Present   Feeling of Stress : Only a little  Social Connections: Unknown   Frequency of Communication with Friends and Family: Three times a week   Frequency of Social Gatherings with Friends and  Family: Three times a week   Attends Religious Services: Not on file   Active Member of Clubs or Organizations: No   Attends Archivist Meetings: Never   Marital Status: Married  Human resources officer Violence: Not At Risk   Fear of Current or Ex-Partner: No   Emotionally Abused: No   Physically Abused: No   Sexually Abused: No    FAMILY HISTORY:  Family History  Problem Relation Age of Onset   Cervical cancer Mother    Stroke Father     CURRENT MEDICATIONS:  Current Outpatient Medications  Medication Sig Dispense Refill   allopurinol (ZYLOPRIM) 300 MG tablet Take 300 mg by mouth daily.     gabapentin (NEURONTIN) 300 MG capsule TAKE 1  CAPSULE BY MOUTH AT BEDTIME 30 capsule 4   HYDROcodone-acetaminophen (NORCO/VICODIN) 5-325 MG tablet Take 1 tablet by mouth every 8 (eight) hours as needed. 60 tablet 0   JANUVIA 100 MG tablet TAKE 1 TABLET BY MOUTH EVERY DAY 90 tablet 6   levothyroxine (SYNTHROID) 150 MCG tablet TAKE 1 TABLET BY MOUTH DAILY BEFORE BREAKFAST 90 tablet 6   LORazepam (ATIVAN) 1 MG tablet Take 1 tablet (1 mg total) by mouth 3 (three) times daily as needed for anxiety. 90 tablet 5   meloxicam (MOBIC) 15 MG tablet Take 1 tablet (15 mg total) by mouth daily. 30 tablet 1   predniSONE (STERAPRED UNI-PAK 21 TAB) 10 MG (21) TBPK tablet Take as directed. 21 tablet 0   TASIGNA 150 MG capsule TAKE 1 CAPSULE BY MOUTH DAILY 84 capsule 0   No current facility-administered medications for this visit.    ALLERGIES:  Allergies  Allergen Reactions   Levaquin [Levofloxacin]    Pecan Nut (Diagnostic)     PHYSICAL EXAM:  Performance status (ECOG): 0 - Asymptomatic  Vitals:   01/24/21 1124  BP: (!) 151/83  Pulse: (!) 105  Resp: 18  Temp: (!) 97 F (36.1 C)  SpO2: 100%   Wt Readings from Last 3 Encounters:  01/24/21 232 lb 12.8 oz (105.6 kg)  06/14/20 240 lb 4.8 oz (109 kg)  02/02/20 (!) 234 lb 6.4 oz (106.3 kg)   Physical Exam Vitals reviewed.  Constitutional:      Appearance: Normal appearance.  HENT:     Mouth/Throat:     Mouth: Mucous membranes are moist.  Eyes:     Pupils: Pupils are equal, round, and reactive to light.  Cardiovascular:     Rate and Rhythm: Normal rate and regular rhythm.     Pulses: Normal pulses.     Heart sounds: Normal heart sounds.  Pulmonary:     Effort: Pulmonary effort is normal.     Breath sounds: Normal breath sounds.  Abdominal:     Palpations: Abdomen is soft. There is no mass.     Tenderness: There is no abdominal tenderness.  Musculoskeletal:     Right lower leg: No edema.     Left lower leg: No edema.  Neurological:     Mental Status: He is alert and oriented to  person, place, and time.  Psychiatric:        Mood and Affect: Mood normal.        Behavior: Behavior normal.    LABORATORY DATA:  I have reviewed the labs as listed.  CBC Latest Ref Rng & Units 01/24/2021 09/20/2020 06/14/2020  WBC 4.0 - 10.5 K/uL 4.8 3.8(L) 4.3  Hemoglobin 13.0 - 17.0 g/dL 11.7(L) 11.4(L) 12.4(L)  Hematocrit 39.0 -  52.0 % 35.8(L) 34.8(L) 37.7(L)  Platelets 150 - 400 K/uL 51(L) 52(L) 58(L)   CMP Latest Ref Rng & Units 01/24/2021 09/20/2020 06/14/2020  Glucose 70 - 99 mg/dL 121(H) 136(H) 169(H)  BUN 6 - 20 mg/dL 14 9 11   Creatinine 0.61 - 1.24 mg/dL 0.80 0.76 0.77  Sodium 135 - 145 mmol/L 134(L) 138 135  Potassium 3.5 - 5.1 mmol/L 4.1 3.6 3.6  Chloride 98 - 111 mmol/L 103 106 104  CO2 22 - 32 mmol/L 23 22 24   Calcium 8.9 - 10.3 mg/dL 8.4(L) 8.4(L) 8.3(L)  Total Protein 6.5 - 8.1 g/dL 8.3(H) 7.8 7.7  Total Bilirubin 0.3 - 1.2 mg/dL 1.4(H) 1.3(H) 1.7(H)  Alkaline Phos 38 - 126 U/L 81 100 69  AST 15 - 41 U/L 113(H) 151(H) 115(H)  ALT 0 - 44 U/L 39 53(H) 42      Component Value Date/Time   RBC 3.69 (L) 01/24/2021 0959   MCV 97.0 01/24/2021 0959   MCH 31.7 01/24/2021 0959   MCHC 32.7 01/24/2021 0959   RDW 15.1 01/24/2021 0959   LYMPHSABS 1.8 01/24/2021 0959   MONOABS 0.7 01/24/2021 0959   EOSABS 0.0 01/24/2021 0959   BASOSABS 0.1 01/24/2021 0959    DIAGNOSTIC IMAGING:  I have independently reviewed the scans and discussed with the patient. No results found.   ASSESSMENT:  1.  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 -Nilotinib 300 mg twice daily started on July 05, 2012, dose reduced to 150 mg daily Monday through Friday in April 2015. -BCR/ABL by PCR has been negative since April 2017.  2.  Thrombocytopenia:  -He has mild to moderate thrombocytopenia since the start of admit. -MRI of the abdomen on 05/20/2018 shows normal-sized spleen but cirrhotic liver configuration.   PLAN:  1.   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  Platelet count 52 (stable. -BCR/ABL by quantitative PCR is pending during dictation.  He has a musculoskeletal pains for which he takes hydrocodone.  This is from Nilotinib.  --Last BCR ABL PCR from March 2022 shows undetectable levels -Continue Nilotinib 150 mg daily Monday through Friday. -Return to clinic in 3 months with labs.  2.  Thrombocytopenia: -This is stable at 52.  No bleeding issues.  3.  Elevated LFTs: -This is from underlying cirrhosis as well as Nilotinib. -AST is 113 and ALT of 39.  Total bilirubin is 1.4.  4.  Hypothyroidism: -Continue Synthroid 150 mcg daily.  TSH today is 10.203 - continue to recheck TSH at next visit.  5.  Hyperlipidemia: -He could not tolerate statins in the past. -His LDL is 100.  HDL is 30.  Total cholesterol is 156.  Triglycerides are 130. -I have recommended dietary modification and exercise.  6.  Diabetes: -Hemoglobin A1c 6.4 at last check on 09/20/2020 -Continue Januvia.  7.  Left shoulder pain: -Has improved without intervention. -X-ray of left shoulder on 06/14/2020 showed calcific tendinopathy of the rotator cuff and degenerative changes around the shoulder.  Questions of prior clavicular ligament injury.  Correlate with signs of shoulder instability. -He was referred to orthopedic in [facility].  Disposition: -RTC in 3 months with lab work (CBC, CMP, lipid panel, TSH, BCR-ABL 1, hemoglobin A1c) and assessment.  Greater than 50% was spent in counseling and coordination of care with this patient including but not limited to discussion of the relevant topics above (See A&P) including, but not limited  to diagnosis and management of acute and chronic medical conditions.   Orders placed this encounter:  No orders of the defined types were placed in this encounter.  Ledell Peoples, MD Department of Hematology/Oncology [place] at  [facility] Phone: [phone] Pager: [phone] Email: [email or web address]

## 2021-01-30 ENCOUNTER — Other Ambulatory Visit (HOSPITAL_COMMUNITY): Payer: Self-pay

## 2021-01-30 DIAGNOSIS — E039 Hypothyroidism, unspecified: Secondary | ICD-10-CM

## 2021-01-30 DIAGNOSIS — C9210 Chronic myeloid leukemia, BCR/ABL-positive, not having achieved remission: Secondary | ICD-10-CM

## 2021-01-31 LAB — BCR-ABL1, CML/ALL, PCR, QUANT: Interpretation (BCRAL):: NEGATIVE

## 2021-02-01 ENCOUNTER — Other Ambulatory Visit (HOSPITAL_COMMUNITY): Payer: Self-pay

## 2021-02-01 DIAGNOSIS — C9210 Chronic myeloid leukemia, BCR/ABL-positive, not having achieved remission: Secondary | ICD-10-CM

## 2021-02-01 MED ORDER — HYDROCODONE-ACETAMINOPHEN 5-325 MG PO TABS: 1.0000 | ORAL_TABLET | Freq: Three times a day (TID) | ORAL | 0 refills | Status: DC | PRN

## 2021-02-02 ENCOUNTER — Other Ambulatory Visit (HOSPITAL_COMMUNITY): Payer: Self-pay | Admitting: *Deleted

## 2021-02-02 DIAGNOSIS — C9210 Chronic myeloid leukemia, BCR/ABL-positive, not having achieved remission: Secondary | ICD-10-CM

## 2021-02-08 ENCOUNTER — Other Ambulatory Visit (HOSPITAL_COMMUNITY): Payer: Self-pay | Admitting: Hematology

## 2021-02-08 DIAGNOSIS — C9210 Chronic myeloid leukemia, BCR/ABL-positive, not having achieved remission: Secondary | ICD-10-CM

## 2021-02-11 NOTE — Progress Notes (Signed)
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 Samuel Gonzales was last seen on 02/02/20.  Today he reports feeling well. He reports a series of 3 falls over the course of 5 months due to loss of balance and dragging of his right foot, and had Cranium Burr Holes on 01/29/21 with Dr. Melonie Florida. Following surgery he had a seizure, and he denies any history of seizures. The dragging and balance issues have resolved since surgery.   REVIEW OF SYSTEMS:  Review of Systems  Constitutional:  Negative for appetite change and fatigue (75%).  All other systems reviewed and are negative.  PAST MEDICAL/SURGICAL HISTORY:  Past Medical History:  Diagnosis Date   Anxiety    Leukemia ([place])    Thyroid disease    No past surgical history on file.  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, chartered  Tobacco Use   Smoking status: Never   Smokeless tobacco: Never  Vaping Use   Vaping Use: Never used  Substance and Sexual Activity   Alcohol use: Yes    Alcohol/week: 21.0 standard drinks    Types: 21 Cans of beer per week   Drug use: Not Currently   Sexual activity: Yes  Other Topics Concern   Not on file  Social History Narrative   Not on file   Social Determinants of Health   Financial Resource Strain: Low Risk    Difficulty of Paying Living Expenses: Not hard at all  Food  Insecurity: No Food Insecurity   Worried About Charity fundraiser in the Last Year: Never true   [place] in the Last Year: Never true  Transportation Needs: No Transportation Needs   Lack of Transportation (Medical): No   Lack of Transportation (Non-Medical): No  Physical Activity: Sufficiently Active   Days of Exercise per Week: 5 days   Minutes of Exercise per Session: 150+ min  Stress: No Stress Concern Present   Feeling of Stress : Only a little  Social Connections: Unknown   Frequency of Communication with Friends and Family: Three times a week   Frequency of Social Gatherings with Friends and Family: Three times a week   Attends Religious Services: Not on file   Active Member of Clubs or Organizations: No   Attends Archivist Meetings: Never   Marital Status: Married  Human resources officer Violence: Not At Risk   Fear of Current or Ex-Partner: No   Emotionally Abused: No   Physically Abused: No   Sexually Abused: No    FAMILY HISTORY:  Family History  Problem Relation Age of Onset   Cervical cancer Mother    Stroke Father     CURRENT MEDICATIONS:  Current Outpatient Medications  Medication Sig Dispense Refill   allopurinol (ZYLOPRIM) 300 MG tablet Take 300 mg by mouth daily.  butalbital-acetaminophen-caffeine (FIORICET) 50-325-40 MG tablet Take by mouth.     gabapentin (NEURONTIN) 300 MG capsule TAKE 1 CAPSULE BY MOUTH AT BEDTIME 30 capsule 4   HYDROcodone-acetaminophen (NORCO/VICODIN) 5-325 MG tablet Take 1 tablet by mouth every 8 (eight) hours as needed. 60 tablet 0   JANUVIA 100 MG tablet TAKE 1 TABLET BY MOUTH EVERY DAY 90 tablet 4   levETIRAcetam (KEPPRA) 1000 MG tablet SMARTSIG:1000 Milligram(s) By Mouth Every 12 Hours     levothyroxine (SYNTHROID) 150 MCG tablet TAKE 1 TABLET BY MOUTH DAILY BEFORE BREAKFAST 90 tablet 6   LORazepam (ATIVAN) 1 MG tablet Take 1 tablet (1 mg total) by mouth 3 (three) times daily as needed for anxiety. 90 tablet 5    losartan (COZAAR) 25 MG tablet Take 25 mg by mouth daily.     magnesium oxide (MAG-OX) 400 MG tablet Take by mouth.     meloxicam (MOBIC) 15 MG tablet Take 1 tablet (15 mg total) by mouth daily. 30 tablet 1   naloxone (NARCAN) nasal spray 4 mg/0.1 mL PLEASE SEE ATTACHED FOR DETAILED DIRECTIONS     oxyCODONE (OXY IR/ROXICODONE) 5 MG immediate release tablet Take by mouth.     predniSONE (STERAPRED UNI-PAK 21 TAB) 10 MG (21) TBPK tablet Take as directed. 21 tablet 0   TASIGNA 150 MG capsule TAKE 1 CAPSULE BY MOUTH DAILY 84 capsule 0   No current facility-administered medications for this visit.    ALLERGIES:  Allergies  Allergen Reactions   Levaquin [Levofloxacin]    Pecan Nut (Diagnostic)     PHYSICAL EXAM:  Performance status (ECOG): 0 - Asymptomatic  Vitals:   02/13/21 1122  Pulse: 88  Resp: 18  Temp: (!) 96.9 F (36.1 C)  SpO2: 99%   Wt Readings from Last 3 Encounters:  02/13/21 232 lb 1.6 oz (105.3 kg)  01/24/21 232 lb 12.8 oz (105.6 kg)  06/14/20 240 lb 4.8 oz (109 kg)   Physical Exam Vitals reviewed.  Constitutional:      Appearance: Normal appearance.  Cardiovascular:     Rate and Rhythm: Normal rate and regular rhythm.     Pulses: Normal pulses.     Heart sounds: Normal heart sounds.  Pulmonary:     Effort: Pulmonary effort is normal.     Breath sounds: Normal breath sounds.  Abdominal:     Palpations: Abdomen is soft. There is no hepatomegaly, splenomegaly or mass.     Tenderness: There is no abdominal tenderness.  Musculoskeletal:     Right lower leg: No edema.     Left lower leg: No edema.  Neurological:     General: No focal deficit present.     Mental Status: He is alert and oriented to person, place, and time.  Psychiatric:        Mood and Affect: Mood normal.        Behavior: Behavior normal.    LABORATORY DATA:  I have reviewed the labs as listed.  CBC Latest Ref Rng & Units 01/24/2021 09/20/2020 06/14/2020  WBC 4.0 - 10.5 K/uL 4.8 3.8(L) 4.3   Hemoglobin 13.0 - 17.0 g/dL 11.7(L) 11.4(L) 12.4(L)  Hematocrit 39.0 - 52.0 % 35.8(L) 34.8(L) 37.7(L)  Platelets 150 - 400 K/uL 51(L) 52(L) 58(L)   CMP Latest Ref Rng & Units 02/13/2021 01/24/2021 09/20/2020  Glucose 70 - 99 mg/dL 109(H) 121(H) 136(H)  BUN 6 - 20 mg/dL '18 14 9  '$ Creatinine 0.61 - 1.24 mg/dL 0.82 0.80 0.76  Sodium 135 - 145 mmol/L  136 134(L) 138  Potassium 3.5 - 5.1 mmol/L 3.7 4.1 3.6  Chloride 98 - 111 mmol/L 106 103 106  CO2 22 - 32 mmol/L '24 23 22  '$ Calcium 8.9 - 10.3 mg/dL 8.8(L) 8.4(L) 8.4(L)  Total Protein 6.5 - 8.1 g/dL 8.2(H) 8.3(H) 7.8  Total Bilirubin 0.3 - 1.2 mg/dL 0.7 1.4(H) 1.3(H)  Alkaline Phos 38 - 126 U/L 106 81 100  AST 15 - 41 U/L 111(H) 113(H) 151(H)  ALT 0 - 44 U/L 57(H) 39 53(H)      Component Value Date/Time   RBC 3.69 (L) 01/24/2021 0959   MCV 97.0 01/24/2021 0959   MCH 31.7 01/24/2021 0959   MCHC 32.7 01/24/2021 0959   RDW 15.1 01/24/2021 0959   LYMPHSABS 1.8 01/24/2021 0959   MONOABS 0.7 01/24/2021 0959   EOSABS 0.0 01/24/2021 0959   BASOSABS 0.1 01/24/2021 0959    DIAGNOSTIC IMAGING:  I have independently reviewed the scans and discussed with the patient. No results found.   ASSESSMENT:  1.  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 -Nilotinib 300 mg twice daily started on July 05, 2012, dose reduced to 150 mg daily Monday through Friday in April 2015. -BCR/ABL by PCR has been negative since April 2017.   2.  Thrombocytopenia: -He has mild to moderate thrombocytopenia since the start of admit. -MRI of the abdomen on 05/20/2018 shows normal-sized spleen but cirrhotic liver configuration.   PLAN:  1.  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 - Last BCR/ABL on 01/24/2021 was undetectable. - He has been having undetectable BCR/ABL for the last 5 years. - Because of his recent subdural hematoma and a thrombocytopenia which is  potentially made worse by Nilotinib, I have recommended holding Nilotinib at this time. - He will check CBC with differential at Dr. Jerene Dilling office in 4 weeks and fax [place] the report. - I plan to see him back in 8 weeks.  I will check his BCR/ABL at that time along with routine labs.   2.  Thrombocytopenia: - Previous platelet count was in the 50s.  Today platelet count is 78.   3.  Elevated LFTs: - This is from underlying cirrhosis as well as Nilotinib. - AST today is 111 and ALT 57 with total bilirubin normal at 0.7.   4.  Hypothyroidism: - His TSH is 15.6, up from 10.23 weeks back. - We will continue same dose Synthroid as he was recently hospitalized and probably missed some doses.   5.  Hyperlipidemia: - He could not tolerate statins in the past. - HDL today slightly low at 37.  LDL is 92 and total cholesterol is 149.  Continue diet modifications.  Continue exercise.   6.  Diabetes: - Hemoglobin A1c today 6.1 and stable. - Continue Januvia.  7.  Bilateral subdural hematoma: - He apparently fell around the time of Memorial Day.  He has developed right foot drop, headaches and right-sided weakness. - He was evaluated at [facility], underwent bur hole craniotomy by Dr. Vevelyn Francois on 01/29/2021.  He was discharged home on 02/02/2021.  He had to receive 2 units of platelets. - Today platelet count is 78.  As the Nilotinib likely to worsen his thrombocytopenia, I have recommended holding Nilotinib at this time.  Orders placed this encounter:  No orders of the defined types were placed in this encounter.    Derek Jack, MD [facility]  [place] [phone]   I, Thana Ates, am acting as a scribe for Dr. Derek Jack.  I, Derek Jack MD, have reviewed the above documentation for accuracy and completeness, and I agree with the above.

## 2021-02-13 ENCOUNTER — Inpatient Hospital Stay (HOSPITAL_COMMUNITY): Payer: 59 | Attending: Hematology

## 2021-02-13 ENCOUNTER — Other Ambulatory Visit (HOSPITAL_COMMUNITY): Payer: Self-pay | Admitting: Surgery

## 2021-02-13 ENCOUNTER — Inpatient Hospital Stay (HOSPITAL_COMMUNITY): Payer: 59 | Admitting: Hematology

## 2021-02-13 ENCOUNTER — Other Ambulatory Visit: Payer: Self-pay

## 2021-02-13 VITALS — HR 88 | Temp 96.9°F | Resp 18 | Wt 232.1 lb

## 2021-02-13 DIAGNOSIS — D696 Thrombocytopenia, unspecified: Secondary | ICD-10-CM | POA: Insufficient documentation

## 2021-02-13 DIAGNOSIS — C9210 Chronic myeloid leukemia, BCR/ABL-positive, not having achieved remission: Secondary | ICD-10-CM

## 2021-02-13 DIAGNOSIS — E119 Type 2 diabetes mellitus without complications: Secondary | ICD-10-CM | POA: Insufficient documentation

## 2021-02-13 DIAGNOSIS — E039 Hypothyroidism, unspecified: Secondary | ICD-10-CM | POA: Insufficient documentation

## 2021-02-13 DIAGNOSIS — Z7984 Long term (current) use of oral hypoglycemic drugs: Secondary | ICD-10-CM | POA: Diagnosis not present

## 2021-02-13 DIAGNOSIS — E785 Hyperlipidemia, unspecified: Secondary | ICD-10-CM | POA: Insufficient documentation

## 2021-02-13 DIAGNOSIS — Z79899 Other long term (current) drug therapy: Secondary | ICD-10-CM | POA: Diagnosis not present

## 2021-02-13 LAB — CBC WITH DIFFERENTIAL/PLATELET
Abs Immature Granulocytes: 0.01 10*3/uL (ref 0.00–0.07)
Basophils Absolute: 0.1 10*3/uL (ref 0.0–0.1)
Basophils Relative: 2 %
Eosinophils Absolute: 0 10*3/uL (ref 0.0–0.5)
Eosinophils Relative: 0 %
HCT: 34.5 % — ABNORMAL LOW (ref 39.0–52.0)
Hemoglobin: 11.1 g/dL — ABNORMAL LOW (ref 13.0–17.0)
Immature Granulocytes: 0 %
Lymphocytes Relative: 43 %
Lymphs Abs: 1.9 10*3/uL (ref 0.7–4.0)
MCH: 30.9 pg (ref 26.0–34.0)
MCHC: 32.2 g/dL (ref 30.0–36.0)
MCV: 96.1 fL (ref 80.0–100.0)
Monocytes Absolute: 0.4 10*3/uL (ref 0.1–1.0)
Monocytes Relative: 9 %
Neutro Abs: 2 10*3/uL (ref 1.7–7.7)
Neutrophils Relative %: 46 %
Platelets: 78 10*3/uL — ABNORMAL LOW (ref 150–400)
RBC: 3.59 MIL/uL — ABNORMAL LOW (ref 4.22–5.81)
RDW: 14.8 % (ref 11.5–15.5)
WBC: 4.4 10*3/uL (ref 4.0–10.5)
nRBC: 0 % (ref 0.0–0.2)

## 2021-02-13 LAB — COMPREHENSIVE METABOLIC PANEL
ALT: 57 U/L — ABNORMAL HIGH (ref 0–44)
AST: 111 U/L — ABNORMAL HIGH (ref 15–41)
Albumin: 3.6 g/dL (ref 3.5–5.0)
Alkaline Phosphatase: 106 U/L (ref 38–126)
Anion gap: 6 (ref 5–15)
BUN: 18 mg/dL (ref 6–20)
CO2: 24 mmol/L (ref 22–32)
Calcium: 8.8 mg/dL — ABNORMAL LOW (ref 8.9–10.3)
Chloride: 106 mmol/L (ref 98–111)
Creatinine, Ser: 0.82 mg/dL (ref 0.61–1.24)
GFR, Estimated: >60 mL/min (ref >60)
Glucose, Bld: 109 mg/dL — ABNORMAL HIGH (ref 70–99)
Potassium: 3.7 mmol/L (ref 3.5–5.1)
Sodium: 136 mmol/L (ref 135–145)
Total Bilirubin: 0.7 mg/dL (ref 0.3–1.2)
Total Protein: 8.2 g/dL — ABNORMAL HIGH (ref 6.5–8.1)

## 2021-02-13 LAB — LIPID PANEL
Cholesterol: 149 mg/dL (ref 0–200)
HDL: 37 mg/dL — ABNORMAL LOW (ref >40)
LDL Cholesterol: 92 mg/dL (ref 0–99)
Total CHOL/HDL Ratio: 4 RATIO
Triglycerides: 100 mg/dL (ref <150)
VLDL: 20 mg/dL (ref 0–40)

## 2021-02-13 LAB — HEMOGLOBIN A1C
Hgb A1c MFr Bld: 6.1 % — ABNORMAL HIGH (ref 4.8–5.6)
Mean Plasma Glucose: 128.37 mg/dL

## 2021-02-13 LAB — TSH: TSH: 15.668 u[IU]/mL — ABNORMAL HIGH (ref 0.350–4.500)

## 2021-02-13 NOTE — Patient Instructions (Addendum)
[place] at [facility], [place] Discharge Instructions  You were seen today by Dr. Delton Coombes. He went over your recent results. Stop taking Nilotinib. Dr. Delton Coombes will see you back in 2 months for labs and follow up.   Thank you for choosing [place] at [facility], [place] to provide your oncology and hematology care.  To afford each patient quality time with our provider, please arrive at least 15 minutes before your scheduled appointment time.   If you have a lab appointment with the [facility] please come in thru the Main Entrance and check in at the main information desk  You need to re-schedule your appointment should you arrive 10 or more minutes late.  We strive to give you quality time with our providers, and arriving late affects you and other patients whose appointments are after yours.  Also, if you no show three or more times for appointments you may be dismissed from the clinic at the providers discretion.     Again, thank you for choosing [facility].  Our hope is that these requests will decrease the amount of time that you wait before being seen by our physicians.       _____________________________________________________________  Should you have questions after your visit to [facility], please contact our office at [phone] between the hours of 8:00 a.m. and 4:30 p.m.  Voicemails left after 4:00 p.m. will not be returned until the following business day.  For prescription refill requests, have your pharmacy contact our office and allow 72 hours.    Cancer Center Support Programs:   > Cancer Support Group  2nd Tuesday of the month 1pm-2pm, Journey Room

## 2021-02-14 ENCOUNTER — Telehealth (HOSPITAL_COMMUNITY): Payer: Self-pay

## 2021-02-14 ENCOUNTER — Telehealth (HOSPITAL_COMMUNITY): Payer: Self-pay | Admitting: *Deleted

## 2021-02-14 NOTE — Telephone Encounter (Signed)
Patients wife called and very adamant on talking to Dr. Raliegh Ip about a personal question. Dr. Raliegh Ip notified about situation with patients number.

## 2021-02-16 ENCOUNTER — Telehealth: Payer: Self-pay | Admitting: General Practice

## 2021-02-16 NOTE — Telephone Encounter (Signed)
[facility] CSW Progress Notes  Call to patient, reached wife.  They would like information/assistance filing for Social Security disability.  He does not have any employer sponsored coverage.  CSW can refer to [facility] w patient permission - he will call me to verify he would like this referral made.  Will also mail brochure on Social Security disability process.  Edwyna Shell, LCSW Clinical Social Worker Phone:  [phone]

## 2021-02-17 LAB — BCR-ABL1 FISH
Cells Analyzed: 200
Cells Counted: 200

## 2021-02-20 ENCOUNTER — Telehealth (HOSPITAL_COMMUNITY): Payer: Self-pay | Admitting: *Deleted

## 2021-02-20 NOTE — Telephone Encounter (Signed)
Opened in error

## 2021-03-02 ENCOUNTER — Other Ambulatory Visit (HOSPITAL_COMMUNITY): Payer: Self-pay

## 2021-03-02 DIAGNOSIS — C9210 Chronic myeloid leukemia, BCR/ABL-positive, not having achieved remission: Secondary | ICD-10-CM

## 2021-03-03 MED ORDER — GABAPENTIN 300 MG PO CAPS: 300.0000 mg | ORAL_CAPSULE | Freq: Every day | ORAL | 4 refills | Status: DC

## 2021-03-03 MED ORDER — HYDROCODONE-ACETAMINOPHEN 5-325 MG PO TABS
1.0000 | ORAL_TABLET | Freq: Three times a day (TID) | ORAL | 0 refills | Status: DC | PRN
Start: 2021-03-03 — End: 2021-03-30

## 2021-03-06 ENCOUNTER — Other Ambulatory Visit (HOSPITAL_COMMUNITY): Payer: Self-pay | Admitting: *Deleted

## 2021-03-06 DIAGNOSIS — C9210 Chronic myeloid leukemia, BCR/ABL-positive, not having achieved remission: Secondary | ICD-10-CM

## 2021-03-07 ENCOUNTER — Other Ambulatory Visit (HOSPITAL_COMMUNITY): Payer: Self-pay | Admitting: *Deleted

## 2021-03-07 DIAGNOSIS — C9210 Chronic myeloid leukemia, BCR/ABL-positive, not having achieved remission: Secondary | ICD-10-CM

## 2021-03-07 MED ORDER — LORAZEPAM 1 MG PO TABS: 1.0000 mg | ORAL_TABLET | Freq: Three times a day (TID) | ORAL | 5 refills | Status: DC | PRN

## 2021-03-30 ENCOUNTER — Other Ambulatory Visit (HOSPITAL_COMMUNITY): Payer: Self-pay | Admitting: *Deleted

## 2021-03-30 DIAGNOSIS — C9210 Chronic myeloid leukemia, BCR/ABL-positive, not having achieved remission: Secondary | ICD-10-CM

## 2021-03-30 MED ORDER — HYDROCODONE-ACETAMINOPHEN 5-325 MG PO TABS: 1.0000 | ORAL_TABLET | Freq: Three times a day (TID) | ORAL | 0 refills | Status: DC | PRN

## 2021-04-08 NOTE — Progress Notes (Signed)
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 SAMNANG Gonzales, a 55 y.o. 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 Harrie was last seen on 02/13/2021.   Today he reports feeling well. He denies any recent falls, headaches, vision changes. He has been taking synthroid but did not take it for 1 week due to issues ordering the medication from the pharmacy.   REVIEW OF SYSTEMS:  Review of Systems  Constitutional:  Positive for fatigue (40%). Negative for appetite change.  Eyes:  Negative for eye problems.  Musculoskeletal:  Positive for arthralgias (5/10).  Neurological:  Negative for headaches.  Psychiatric/Behavioral:  Positive for sleep disturbance.   All other systems reviewed and are negative.  PAST MEDICAL/SURGICAL HISTORY:  Past Medical History:  Diagnosis Date   Anxiety    Leukemia ([place])    Thyroid disease    No past surgical history on file.  SOCIAL HISTORY:  Social History   Socioeconomic History   Marital status: Married    Spouse name: Gari Hartsell   Number of children: 1   Years of education: Not on file   Highest education level: High school graduate  Occupational History   Occupation: [employer]     CommentLoss adjuster, chartered  Tobacco Use   Smoking status: Never   Smokeless tobacco: Never  Vaping Use   Vaping Use: Never used  Substance and Sexual Activity   Alcohol use: Yes    Alcohol/week: 21.0 standard drinks    Types: 21 Cans of beer per week   Drug use: Not Currently   Sexual activity: Yes  Other Topics Concern   Not on file  Social History Narrative   Not on file   Social Determinants of  Health   Financial Resource Strain: Low Risk    Difficulty of Paying Living Expenses: Not hard at all  Food Insecurity: No Food Insecurity   Worried About Charity fundraiser in the Last Year: Never true   [place] in the Last Year: Never true  Transportation Needs: No Transportation Needs   Lack of Transportation (Medical): No   Lack of Transportation (Non-Medical): No  Physical Activity: Sufficiently Active   Days of Exercise per Week: 5 days   Minutes of Exercise per Session: 150+ min  Stress: No Stress Concern Present   Feeling of Stress : Only a little  Social Connections: Unknown   Frequency of Communication with Friends and Family: Three times a week   Frequency of Social Gatherings with Friends and Family: Three times a week   Attends Religious Services: Not on file   Active Member of Clubs or Organizations: No   Attends Archivist Meetings: Never   Marital Status: Married  Human resources officer Violence: Not At Risk   Fear of Current or Ex-Partner: No   Emotionally Abused: No   Physically Abused: No   Sexually Abused: No    FAMILY HISTORY:  Family History  Problem Relation Age of Onset   Cervical cancer Mother    Stroke Father     CURRENT MEDICATIONS:  Current Outpatient Medications  Medication Sig Dispense Refill  allopurinol (ZYLOPRIM) 300 MG tablet Take 300 mg by mouth daily.     butalbital-acetaminophen-caffeine (FIORICET) 50-325-40 MG tablet Take by mouth.     gabapentin (NEURONTIN) 300 MG capsule Take 1 capsule (300 mg total) by mouth at bedtime. 30 capsule 4   HYDROcodone-acetaminophen (NORCO/VICODIN) 5-325 MG tablet Take 1 tablet by mouth every 8 (eight) hours as needed. 60 tablet 0   JANUVIA 100 MG tablet TAKE 1 TABLET BY MOUTH EVERY DAY 90 tablet 4   levETIRAcetam (KEPPRA) 1000 MG tablet SMARTSIG:1000 Milligram(s) By Mouth Every 12 Hours     levothyroxine (SYNTHROID) 150 MCG tablet TAKE 1 TABLET BY MOUTH DAILY BEFORE BREAKFAST 90 tablet 6    LORazepam (ATIVAN) 1 MG tablet Take 1 tablet (1 mg total) by mouth 3 (three) times daily as needed for anxiety. 90 tablet 5   losartan (COZAAR) 25 MG tablet Take 25 mg by mouth daily.     magnesium oxide (MAG-OX) 400 MG tablet Take by mouth.     meloxicam (MOBIC) 15 MG tablet Take 1 tablet (15 mg total) by mouth daily. 30 tablet 1   naloxone (NARCAN) nasal spray 4 mg/0.1 mL PLEASE SEE ATTACHED FOR DETAILED DIRECTIONS     oxyCODONE (OXY IR/ROXICODONE) 5 MG immediate release tablet Take by mouth.     predniSONE (STERAPRED UNI-PAK 21 TAB) 10 MG (21) TBPK tablet Take as directed. 21 tablet 0   TASIGNA 150 MG capsule TAKE 1 CAPSULE BY MOUTH DAILY 84 capsule 0   No current facility-administered medications for this visit.    ALLERGIES:  Allergies  Allergen Reactions   Levaquin [Levofloxacin]    Pecan Nut (Diagnostic)     PHYSICAL EXAM:  Performance status (ECOG): 0 - Asymptomatic  There were no vitals filed for this visit. Wt Readings from Last 3 Encounters:  02/13/21 232 lb 1.6 oz (105.3 kg)  01/24/21 232 lb 12.8 oz (105.6 kg)  06/14/20 240 lb 4.8 oz (109 kg)   Physical Exam Vitals reviewed.  Constitutional:      Appearance: Normal appearance.  Cardiovascular:     Rate and Rhythm: Normal rate and regular rhythm.     Pulses: Normal pulses.     Heart sounds: Normal heart sounds.  Pulmonary:     Effort: Pulmonary effort is normal.     Breath sounds: Normal breath sounds.  Abdominal:     Palpations: Abdomen is soft. There is no hepatomegaly, splenomegaly or mass.     Tenderness: There is no abdominal tenderness.  Musculoskeletal:     Right lower leg: No edema.     Left lower leg: No edema.  Neurological:     General: No focal deficit present.     Mental Status: He is alert and oriented to person, place, and time.  Psychiatric:        Mood and Affect: Mood normal.        Behavior: Behavior normal.     LABORATORY DATA:  I have reviewed the labs as listed.  CBC Latest  Ref Rng & Units 02/13/2021 01/24/2021 09/20/2020  WBC 4.0 - 10.5 K/uL 4.4 4.8 3.8(L)  Hemoglobin 13.0 - 17.0 g/dL 11.1(L) 11.7(L) 11.4(L)  Hematocrit 39.0 - 52.0 % 34.5(L) 35.8(L) 34.8(L)  Platelets 150 - 400 K/uL 78(L) 51(L) 52(L)   CMP Latest Ref Rng & Units 02/13/2021 01/24/2021 09/20/2020  Glucose 70 - 99 mg/dL 109(H) 121(H) 136(H)  BUN 6 - 20 mg/dL 18 14 9   Creatinine 0.61 - 1.24 mg/dL 0.82 0.80 0.76  Sodium  135 - 145 mmol/L 136 134(L) 138  Potassium 3.5 - 5.1 mmol/L 3.7 4.1 3.6  Chloride 98 - 111 mmol/L 106 103 106  CO2 22 - 32 mmol/L 24 23 22   Calcium 8.9 - 10.3 mg/dL 8.8(L) 8.4(L) 8.4(L)  Total Protein 6.5 - 8.1 g/dL 8.2(H) 8.3(H) 7.8  Total Bilirubin 0.3 - 1.2 mg/dL 0.7 1.4(H) 1.3(H)  Alkaline Phos 38 - 126 U/L 106 81 100  AST 15 - 41 U/L 111(H) 113(H) 151(H)  ALT 0 - 44 U/L 57(H) 39 53(H)    DIAGNOSTIC IMAGING:  I have independently reviewed the scans and discussed with the patient. No results found.   ASSESSMENT:  1.  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 -Nilotinib 300 mg twice daily started on July 05, 2012, dose reduced to 150 mg daily Monday through Friday in April 2015. -BCR/ABL by PCR has been negative since April 2017. - Nilotinib was discontinued on 02/13/2021.   2.  Thrombocytopenia: -He has mild to moderate thrombocytopenia since the start of admit. -MRI of the abdomen on 05/20/2018 shows normal-sized spleen but cirrhotic liver configuration.   PLAN:  1.  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  He was also in complete molecular response since 2017. - Reviewed labs from 04/10/2021.  White count is 4.0 with 37% neutrophils and hemoglobin 12.1.  BCR/ABL is pending from today.  Last test in July was negative. - I will continue to hold his Nilotinib at this time  unless his BCR/ABL becomes positive. - RTC 8 weeks  for follow-up with labs. - He is gone back to full-time job.   2.  Thrombocytopenia: - Platelet count today is 53,000.  Previously 78,000 at last visit when he was taking Nilotinib.  We will closely monitor.   3.  Elevated LFTs: - This is from underlying cirrhosis. - AST today is 151 and ALT 51 with total bilirubin 1.1.   4.  Hypothyroidism: - He reports that he is taking Synthroid 150 mcg daily.  However his TSH has gone up to 73 from 15 at last visit. - Would recommend increasing Synthroid to 200 mcg daily.   5.  Hyperlipidemia: - He could not tolerate statins in the past. - HDL today is 53.  Total cholesterol is 201 and LDL is 129.  Triglycerides are 93.   6.  Diabetes: - Hemoglobin A1c is 5.9 today.  Glucose was 123.  Continue Januvia 100 mg daily.  7.  Bilateral subdural hematoma: - He fell around the time of [facility] Day, developed right foot drop and headaches and right-sided weakness. - He was evaluated at an abnormal hospital,  underwent burr hole craniotomy by Dr. Vevelyn Francois on 01/29/2021 and was discharged home on 02/02/2021.  He had to receive 2 units of platelets. - I have held his Nilotinib on 02/13/2021.   Orders placed this encounter:  No orders of the defined types were placed in this encounter.    Derek Jack, MD [place] [phone]   I, Thana Ates, am acting as a scribe for Dr. Derek Jack.  I, Derek Jack MD, have reviewed the above documentation for accuracy and completeness, and I agree with the above.

## 2021-04-10 ENCOUNTER — Other Ambulatory Visit (HOSPITAL_COMMUNITY): Payer: Self-pay

## 2021-04-10 ENCOUNTER — Inpatient Hospital Stay (HOSPITAL_COMMUNITY): Payer: 59 | Admitting: Hematology

## 2021-04-10 ENCOUNTER — Other Ambulatory Visit: Payer: Self-pay

## 2021-04-10 ENCOUNTER — Inpatient Hospital Stay (HOSPITAL_COMMUNITY): Payer: 59

## 2021-04-10 ENCOUNTER — Other Ambulatory Visit (HOSPITAL_COMMUNITY): Payer: Self-pay | Admitting: Hematology

## 2021-04-10 ENCOUNTER — Ambulatory Visit (HOSPITAL_COMMUNITY): Payer: 59 | Admitting: Hematology

## 2021-04-10 ENCOUNTER — Inpatient Hospital Stay (HOSPITAL_COMMUNITY): Payer: 59 | Attending: Hematology

## 2021-04-10 VITALS — BP 112/76 | HR 108 | Temp 96.8°F | Resp 16 | Wt 234.4 lb

## 2021-04-10 DIAGNOSIS — E039 Hypothyroidism, unspecified: Secondary | ICD-10-CM

## 2021-04-10 DIAGNOSIS — C9210 Chronic myeloid leukemia, BCR/ABL-positive, not having achieved remission: Secondary | ICD-10-CM

## 2021-04-10 DIAGNOSIS — R945 Abnormal results of liver function studies: Secondary | ICD-10-CM | POA: Insufficient documentation

## 2021-04-10 DIAGNOSIS — Z7984 Long term (current) use of oral hypoglycemic drugs: Secondary | ICD-10-CM | POA: Insufficient documentation

## 2021-04-10 DIAGNOSIS — D696 Thrombocytopenia, unspecified: Secondary | ICD-10-CM | POA: Insufficient documentation

## 2021-04-10 DIAGNOSIS — E119 Type 2 diabetes mellitus without complications: Secondary | ICD-10-CM | POA: Diagnosis not present

## 2021-04-10 DIAGNOSIS — E785 Hyperlipidemia, unspecified: Secondary | ICD-10-CM | POA: Insufficient documentation

## 2021-04-10 LAB — CBC WITH DIFFERENTIAL/PLATELET
Abs Immature Granulocytes: 0.02 10*3/uL (ref 0.00–0.07)
Basophils Absolute: 0.1 10*3/uL (ref 0.0–0.1)
Basophils Relative: 2 %
Eosinophils Absolute: 0 10*3/uL (ref 0.0–0.5)
Eosinophils Relative: 1 %
HCT: 38 % — ABNORMAL LOW (ref 39.0–52.0)
Hemoglobin: 12.1 g/dL — ABNORMAL LOW (ref 13.0–17.0)
Immature Granulocytes: 1 %
Lymphocytes Relative: 50 %
Lymphs Abs: 2 10*3/uL (ref 0.7–4.0)
MCH: 30.3 pg (ref 26.0–34.0)
MCHC: 31.8 g/dL (ref 30.0–36.0)
MCV: 95 fL (ref 80.0–100.0)
Monocytes Absolute: 0.3 10*3/uL (ref 0.1–1.0)
Monocytes Relative: 9 %
Neutro Abs: 1.5 10*3/uL — ABNORMAL LOW (ref 1.7–7.7)
Neutrophils Relative %: 37 %
Platelets: 53 10*3/uL — ABNORMAL LOW (ref 150–400)
RBC: 4 MIL/uL — ABNORMAL LOW (ref 4.22–5.81)
RDW: 17.2 % — ABNORMAL HIGH (ref 11.5–15.5)
WBC: 4 10*3/uL (ref 4.0–10.5)
nRBC: 0 % (ref 0.0–0.2)

## 2021-04-10 LAB — COMPREHENSIVE METABOLIC PANEL
ALT: 50 U/L — ABNORMAL HIGH (ref 0–44)
AST: 151 U/L — ABNORMAL HIGH (ref 15–41)
Albumin: 3.8 g/dL (ref 3.5–5.0)
Alkaline Phosphatase: 105 U/L (ref 38–126)
Anion gap: 8 (ref 5–15)
BUN: 12 mg/dL (ref 6–20)
CO2: 26 mmol/L (ref 22–32)
Calcium: 8.8 mg/dL — ABNORMAL LOW (ref 8.9–10.3)
Chloride: 104 mmol/L (ref 98–111)
Creatinine, Ser: 0.84 mg/dL (ref 0.61–1.24)
GFR, Estimated: >60 mL/min (ref >60)
Glucose, Bld: 123 mg/dL — ABNORMAL HIGH (ref 70–99)
Potassium: 3.9 mmol/L (ref 3.5–5.1)
Sodium: 138 mmol/L (ref 135–145)
Total Bilirubin: 1.1 mg/dL (ref 0.3–1.2)
Total Protein: 8.4 g/dL — ABNORMAL HIGH (ref 6.5–8.1)

## 2021-04-10 LAB — LIPID PANEL
Cholesterol: 201 mg/dL — ABNORMAL HIGH (ref 0–200)
HDL: 53 mg/dL (ref >40)
LDL Cholesterol: 129 mg/dL — ABNORMAL HIGH (ref 0–99)
Total CHOL/HDL Ratio: 3.8 RATIO
Triglycerides: 93 mg/dL (ref <150)
VLDL: 19 mg/dL (ref 0–40)

## 2021-04-10 LAB — TSH: TSH: 73.365 u[IU]/mL — ABNORMAL HIGH (ref 0.350–4.500)

## 2021-04-10 MED ORDER — LEVOTHYROXINE SODIUM 200 MCG PO TABS: ORAL_TABLET | ORAL | 2 refills | Status: DC

## 2021-04-10 MED ORDER — SITAGLIPTIN PHOSPHATE 100 MG PO TABS: 100.0000 mg | ORAL_TABLET | Freq: Every day | ORAL | 4 refills | Status: DC

## 2021-04-10 MED ORDER — GABAPENTIN 300 MG PO CAPS: ORAL_CAPSULE | ORAL | 4 refills | Status: DC

## 2021-04-10 NOTE — Patient Instructions (Signed)
[facility] at [facility] Discharge Instructions  You were seen today by Dr. Katragadda. He went over your recent results. Dr. Katragadda will see you back in 2 months for labs and follow up.   Thank you for choosing [facility] at [facility] to provide your oncology and hematology care.  To afford each patient quality time with our provider, please arrive at least 15 minutes before your scheduled appointment time.   If you have a lab appointment with the Cancer Center please come in thru the Main Entrance and check in at the main information desk  You need to re-schedule your appointment should you arrive 10 or more minutes late.  We strive to give you quality time with our providers, and arriving late affects you and other patients whose appointments are after yours.  Also, if you no show three or more times for appointments you may be dismissed from the clinic at the providers discretion.     Again, thank you for choosing [facility].  Our hope is that these requests will decrease the amount of time that you wait before being seen by our physicians.       _____________________________________________________________  Should you have questions after your visit to [facility], please contact our office at [phone] between the hours of 8:00 a.m. and 4:30 p.m.  Voicemails left after 4:00 p.m. will not be returned until the following business day.  For prescription refill requests, have your pharmacy contact our office and allow 72 hours.    Cancer Center Support Programs:   > Cancer Support Group  2nd Tuesday of the month 1pm-2pm, Journey Room   

## 2021-04-11 LAB — HEMOGLOBIN A1C
Hgb A1c MFr Bld: 5.9 % — ABNORMAL HIGH (ref 4.8–5.6)
Mean Plasma Glucose: 123 mg/dL

## 2021-04-20 LAB — BCR-ABL1, CML/ALL, PCR, QUANT: Interpretation (BCRAL):: NEGATIVE

## 2021-04-25 ENCOUNTER — Other Ambulatory Visit (HOSPITAL_COMMUNITY): Payer: 59

## 2021-04-25 ENCOUNTER — Ambulatory Visit (HOSPITAL_COMMUNITY): Payer: 59 | Admitting: Hematology

## 2021-05-01 ENCOUNTER — Other Ambulatory Visit (HOSPITAL_COMMUNITY): Payer: Self-pay | Admitting: *Deleted

## 2021-05-01 DIAGNOSIS — E039 Hypothyroidism, unspecified: Secondary | ICD-10-CM

## 2021-05-01 MED ORDER — LEVOTHYROXINE SODIUM 200 MCG PO TABS: ORAL_TABLET | ORAL | 3 refills | Status: DC

## 2021-05-03 ENCOUNTER — Other Ambulatory Visit (HOSPITAL_COMMUNITY): Payer: Self-pay | Admitting: Hematology

## 2021-05-03 ENCOUNTER — Other Ambulatory Visit (HOSPITAL_COMMUNITY): Payer: Self-pay | Admitting: *Deleted

## 2021-05-03 DIAGNOSIS — C9210 Chronic myeloid leukemia, BCR/ABL-positive, not having achieved remission: Secondary | ICD-10-CM

## 2021-05-03 MED ORDER — HYDROCODONE-ACETAMINOPHEN 5-325 MG PO TABS: 1.0000 | ORAL_TABLET | Freq: Three times a day (TID) | ORAL | 0 refills | Status: DC | PRN

## 2021-05-30 ENCOUNTER — Other Ambulatory Visit (HOSPITAL_COMMUNITY): Payer: Self-pay | Admitting: *Deleted

## 2021-05-30 DIAGNOSIS — C9210 Chronic myeloid leukemia, BCR/ABL-positive, not having achieved remission: Secondary | ICD-10-CM

## 2021-05-30 MED ORDER — LORAZEPAM 1 MG PO TABS: 1.0000 mg | ORAL_TABLET | Freq: Three times a day (TID) | ORAL | 0 refills | Status: DC | PRN

## 2021-05-30 MED ORDER — HYDROCODONE-ACETAMINOPHEN 5-325 MG PO TABS: 1.0000 | ORAL_TABLET | Freq: Three times a day (TID) | ORAL | 0 refills | Status: DC | PRN

## 2021-05-30 NOTE — Telephone Encounter (Signed)
Wife notified.

## 2021-05-30 NOTE — Telephone Encounter (Signed)
Primary prescriber is Dr. Derek Jack.  Short-term refill written by R. Noele Icenhour PA-C while Dr. Delton Coombes is out of office.

## 2021-06-05 NOTE — Progress Notes (Signed)
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    CANCER STAGING:  Cancer Staging  No matching staging information was found for the patient.  INTERVAL HISTORY:  [name], a 55 y.o. 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 Jayce was last seen on 04/10/2021.   Today he reports feeling well. He reports severe full body bone pains which is worsened with exertion. He is taking 3 Hydrocodone daily. He denies any recent infections.   REVIEW OF SYSTEMS:  Review of Systems  Constitutional:  Positive for fatigue (40%). Negative for appetite change.  Respiratory:  Positive for shortness of breath.   Cardiovascular:  Positive for palpitations.  Musculoskeletal:  Positive for arthralgias (9/10).  Neurological:  Positive for numbness (toes).  Psychiatric/Behavioral:  Positive for sleep disturbance.   All other systems reviewed and are negative.  PAST MEDICAL/SURGICAL HISTORY:  Past Medical History:  Diagnosis Date   Anxiety    Leukemia ([place])    Thyroid disease    No past surgical history on file.  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, chartered  Tobacco Use   Smoking status: Never   Smokeless tobacco: Never  Vaping Use   Vaping Use: Never used  Substance and Sexual Activity   Alcohol use: Yes    Alcohol/week: 21.0 standard  drinks    Types: 21 Cans of beer per week   Drug use: Not Currently   Sexual activity: Yes  Other Topics Concern   Not on file  Social History Narrative   Not on file   Social Determinants of Health   Financial Resource Strain: Low Risk    Difficulty of Paying Living Expenses: Not hard at all  Food Insecurity: No Food Insecurity   Worried About Charity fundraiser in the Last Year: Never true   [place] in the Last Year: Never true  Transportation Needs: No Transportation Needs   Lack of Transportation (Medical): No   Lack of Transportation (Non-Medical): No  Physical Activity: Sufficiently Active   Days of Exercise per Week: 5 days   Minutes of Exercise per Session: 150+ min  Stress: No Stress Concern Present   Feeling of Stress : Only a little  Social Connections: Unknown   Frequency of Communication with Friends and Family: Three times a week   Frequency of Social Gatherings with Friends and Family: Three times a week   Attends Religious Services: Not on file   Active Member of Clubs or Organizations: No   Attends Archivist Meetings: Never   Marital Status: Married  Human resources officer Violence: Not At Risk   Fear of Current or Ex-Partner: No   Emotionally Abused: No   Physically Abused: No   Sexually Abused: No    FAMILY HISTORY:  Family History  Problem Relation Age of Onset   Cervical cancer Mother    Stroke Father     CURRENT MEDICATIONS:  Current Outpatient Medications  Medication Sig Dispense Refill   allopurinol (ZYLOPRIM) 300 MG tablet Take 300 mg by mouth daily.     butalbital-acetaminophen-caffeine (FIORICET) 50-325-40 MG tablet Take by mouth.     gabapentin (NEURONTIN) 300 MG capsule TAKE 1 CAPSULE BY MOUTH EVERYDAY AT BEDTIME 30 capsule 4   HYDROcodone-acetaminophen (NORCO/VICODIN) 5-325 MG tablet Take 1 tablet by mouth every 8 (eight) hours as needed. 30 tablet 0   levothyroxine (SYNTHROID) 200 MCG tablet TAKE 1 TABLET BY MOUTH DAILY  BEFORE BREAKFAST 90 tablet 3   LORazepam (ATIVAN) 1 MG tablet Take 1 tablet (1 mg total) by mouth 3 (three) times daily as needed for anxiety. 30 tablet 0   meloxicam (MOBIC) 15 MG tablet Take 1 tablet (15 mg total) by mouth daily. 30 tablet 1   predniSONE (STERAPRED UNI-PAK 21 TAB) 10 MG (21) TBPK tablet Take as directed. 21 tablet 0   sitaGLIPtin (JANUVIA) 100 MG tablet Take 1 tablet (100 mg total) by mouth daily. 90 tablet 4   TASIGNA 150 MG capsule TAKE 1 CAPSULE BY MOUTH DAILY 84 capsule 0   No current facility-administered medications for this visit.    ALLERGIES:  Allergies  Allergen Reactions   Levaquin [Levofloxacin]    Pecan Nut (Diagnostic)     PHYSICAL EXAM:  Performance status (ECOG): 0 - Asymptomatic  Vitals:   06/06/21 1127  BP: (!) 138/93  Pulse: (!) 104  Resp: 18  Temp: (!) 97 F (36.1 C)  SpO2: 98%   Wt Readings from Last 3 Encounters:  06/06/21 231 lb (104.8 kg)  04/10/21 234 lb 6.4 oz (106.3 kg)  02/13/21 232 lb 1.6 oz (105.3 kg)   Physical Exam Vitals reviewed.  Constitutional:      Appearance: Normal appearance.  Cardiovascular:     Rate and Rhythm: Normal rate and regular rhythm.     Pulses: Normal pulses.     Heart sounds: Normal heart sounds.  Pulmonary:     Effort: Pulmonary effort is normal.     Breath sounds: Normal breath sounds.  Musculoskeletal:     Right lower leg: No edema.     Left lower leg: No edema.  Neurological:     General: No focal deficit present.     Mental Status: He is alert and oriented to person, place, and time.  Psychiatric:        Mood and Affect: Mood normal.        Behavior: Behavior normal.     LABORATORY DATA:  I have reviewed the labs as listed.  CBC Latest Ref Rng & Units 06/06/2021 04/10/2021 02/13/2021  WBC 4.0 - 10.5 K/uL 3.8(L) 4.0 4.4  Hemoglobin 13.0 - 17.0 g/dL 11.7(L) 12.1(L) 11.1(L)  Hematocrit 39.0 - 52.0 % 35.6(L) 38.0(L) 34.5(L)  Platelets 150 - 400 K/uL PENDING 53(L) 78(L)   CMP Latest  Ref Rng & Units 06/06/2021 04/10/2021 02/13/2021  Glucose 70 - 99 mg/dL 111(H) 123(H) 109(H)  BUN 6 - 20 mg/dL 16 12 18   Creatinine 0.61 - 1.24 mg/dL 0.70 0.84 0.82  Sodium 135 - 145 mmol/L 136 138 136  Potassium 3.5 - 5.1 mmol/L 3.6 3.9 3.7  Chloride 98 - 111 mmol/L 105 104 106  CO2 22 - 32 mmol/L 22 26 24   Calcium 8.9 - 10.3 mg/dL 9.1 8.8(L) 8.8(L)  Total Protein 6.5 - 8.1 g/dL 8.1 8.4(H)  8.2(H)  Total Bilirubin 0.3 - 1.2 mg/dL 1.3(H) 1.1 0.7  Alkaline Phos 38 - 126 U/L 84 105 106  AST 15 - 41 U/L 81(H) 151(H) 111(H)  ALT 0 - 44 U/L 28 50(H) 57(H)    DIAGNOSTIC IMAGING:  I have independently reviewed the scans and discussed with the patient. No results found.   ASSESSMENT:  1.  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 -Nilotinib 300 mg twice daily started on July 05, 2012, dose reduced to 150 mg daily Monday through Friday in April 2015. -BCR/ABL by PCR has been negative since April 2017. - Nilotinib was discontinued on 02/13/2021.   2.  Thrombocytopenia: -He has mild to moderate thrombocytopenia since the start of admit. -MRI of the abdomen on 05/20/2018 shows normal-sized spleen but cirrhotic liver configuration.   PLAN:  1.  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 - He had complete molecular response since 2017. - Last BCR/ABL on 04/10/2021 was undetectable.  Test from today is pending. - Reviewed labs from today which showed white count 3.8 and normal differential.  Hemoglobin 11.7. - We will follow-up on BCR/ABL today. - He reports bone pains particularly in the forearms since Nilotinib was discontinued. - Continue hydrocodone 3 times daily as needed. - RTC 8 weeks for follow-up with repeat labs.   2.  Thrombocytopenia: - Platelet count today is 53 and stable likely from splenomegaly.  Closely monitor.   3.  Elevated LFTs: - This could be from underlying cirrhosis.  Today AST  improved to 81 and normalization of ALT.  Total bilirubin 1.3.   4.  Hypothyroidism: - He is taking Synthroid 200 mcg daily.  TSH today is 0.202.  It is down from 73. - I will cut back Synthroid to 150 mcg daily.   5.  Hyperlipidemia: - He could not tolerate statins in the past.  Last lipid panel shows LDL 129 and total cholesterol 201.   6.  Diabetes: - Continue Januvia 100 mg daily.  Last hemoglobin A1c was 5.9.  7.  Bilateral subdural hematoma: - He fell around [facility], developed right foot drop and headaches and right-sided weakness. - Evaluated at [facility], underwent burr hole craniotomy by Dr. Vevelyn Francois on 01/29/2021.  Required 2 units of platelets.    Orders placed this encounter:  No orders of the defined types were placed in this encounter.    Derek Jack, MD [place] [phone]   I, Thana Ates, am acting as a scribe for Dr. Derek Jack.  I, Derek Jack MD, have reviewed the above documentation for accuracy and completeness, and I agree with the above.

## 2021-06-06 ENCOUNTER — Inpatient Hospital Stay (HOSPITAL_COMMUNITY): Payer: 59 | Attending: Hematology | Admitting: Hematology

## 2021-06-06 ENCOUNTER — Other Ambulatory Visit (HOSPITAL_COMMUNITY): Payer: Self-pay

## 2021-06-06 ENCOUNTER — Inpatient Hospital Stay (HOSPITAL_COMMUNITY): Payer: 59

## 2021-06-06 ENCOUNTER — Other Ambulatory Visit: Payer: Self-pay

## 2021-06-06 VITALS — BP 138/93 | HR 104 | Temp 97.0°F | Resp 18 | Wt 231.0 lb

## 2021-06-06 DIAGNOSIS — R945 Abnormal results of liver function studies: Secondary | ICD-10-CM | POA: Diagnosis not present

## 2021-06-06 DIAGNOSIS — E039 Hypothyroidism, unspecified: Secondary | ICD-10-CM | POA: Insufficient documentation

## 2021-06-06 DIAGNOSIS — D696 Thrombocytopenia, unspecified: Secondary | ICD-10-CM | POA: Insufficient documentation

## 2021-06-06 DIAGNOSIS — E119 Type 2 diabetes mellitus without complications: Secondary | ICD-10-CM | POA: Insufficient documentation

## 2021-06-06 DIAGNOSIS — C9210 Chronic myeloid leukemia, BCR/ABL-positive, not having achieved remission: Secondary | ICD-10-CM | POA: Insufficient documentation

## 2021-06-06 DIAGNOSIS — Z79899 Other long term (current) drug therapy: Secondary | ICD-10-CM | POA: Insufficient documentation

## 2021-06-06 DIAGNOSIS — E785 Hyperlipidemia, unspecified: Secondary | ICD-10-CM | POA: Diagnosis not present

## 2021-06-06 LAB — CBC WITH DIFFERENTIAL/PLATELET
Abs Immature Granulocytes: 0 10*3/uL (ref 0.00–0.07)
Basophils Absolute: 0.1 10*3/uL (ref 0.0–0.1)
Basophils Relative: 3 %
Eosinophils Absolute: 0 10*3/uL (ref 0.0–0.5)
Eosinophils Relative: 1 %
HCT: 35.6 % — ABNORMAL LOW (ref 39.0–52.0)
Hemoglobin: 11.7 g/dL — ABNORMAL LOW (ref 13.0–17.0)
Immature Granulocytes: 0 %
Lymphocytes Relative: 44 %
Lymphs Abs: 1.7 10*3/uL (ref 0.7–4.0)
MCH: 31 pg (ref 26.0–34.0)
MCHC: 32.9 g/dL (ref 30.0–36.0)
MCV: 94.2 fL (ref 80.0–100.0)
Monocytes Absolute: 0.5 10*3/uL (ref 0.1–1.0)
Monocytes Relative: 13 %
Neutro Abs: 1.5 10*3/uL — ABNORMAL LOW (ref 1.7–7.7)
Neutrophils Relative %: 39 %
Platelets: 53 10*3/uL — ABNORMAL LOW (ref 150–400)
RBC: 3.78 MIL/uL — ABNORMAL LOW (ref 4.22–5.81)
RDW: 15.9 % — ABNORMAL HIGH (ref 11.5–15.5)
WBC: 3.8 10*3/uL — ABNORMAL LOW (ref 4.0–10.5)
nRBC: 0 % (ref 0.0–0.2)

## 2021-06-06 LAB — COMPREHENSIVE METABOLIC PANEL
ALT: 28 U/L (ref 0–44)
AST: 81 U/L — ABNORMAL HIGH (ref 15–41)
Albumin: 3.6 g/dL (ref 3.5–5.0)
Alkaline Phosphatase: 84 U/L (ref 38–126)
Anion gap: 9 (ref 5–15)
BUN: 16 mg/dL (ref 6–20)
CO2: 22 mmol/L (ref 22–32)
Calcium: 9.1 mg/dL (ref 8.9–10.3)
Chloride: 105 mmol/L (ref 98–111)
Creatinine, Ser: 0.7 mg/dL (ref 0.61–1.24)
GFR, Estimated: >60 mL/min (ref >60)
Glucose, Bld: 111 mg/dL — ABNORMAL HIGH (ref 70–99)
Potassium: 3.6 mmol/L (ref 3.5–5.1)
Sodium: 136 mmol/L (ref 135–145)
Total Bilirubin: 1.3 mg/dL — ABNORMAL HIGH (ref 0.3–1.2)
Total Protein: 8.1 g/dL (ref 6.5–8.1)

## 2021-06-06 LAB — TSH: TSH: 0.202 u[IU]/mL — ABNORMAL LOW (ref 0.350–4.500)

## 2021-06-06 MED ORDER — LORAZEPAM 1 MG PO TABS: 1.0000 mg | ORAL_TABLET | Freq: Three times a day (TID) | ORAL | 0 refills | Status: DC | PRN

## 2021-06-06 MED ORDER — HYDROCODONE-ACETAMINOPHEN 5-325 MG PO TABS: 1.0000 | ORAL_TABLET | Freq: Three times a day (TID) | ORAL | 0 refills | Status: DC | PRN

## 2021-06-06 NOTE — Patient Instructions (Addendum)
[place] at [facility] Discharge Instructions   You were seen and examined today by Dr. Delton Coombes. He reviewed your available lab results, which were normal/stable. We will call you with the results of your special leukemia test. Decrease Synthroid to 150 mcg daily - take on an empty stomach.  Return as scheduled for lab work and office visit.    Thank you for choosing [place] at [facility] to provide your oncology and hematology care.  To afford each patient quality time with our provider, please arrive at least 15 minutes before your scheduled appointment time.   If you have a lab appointment with the [facility] please come in thru the Main Entrance and check in at the main information desk.  You need to re-schedule your appointment should you arrive 10 or more minutes late.  We strive to give you quality time with our providers, and arriving late affects you and other patients whose appointments are after yours.  Also, if you no show three or more times for appointments you may be dismissed from the clinic at the providers discretion.     Again, thank you for choosing [facility].  Our hope is that these requests will decrease the amount of time that you wait before being seen by our physicians.       _____________________________________________________________  Should you have questions after your visit to [facility], please contact our office at [phone] and follow the prompts.  Our office hours are 8:00 a.m. and 4:30 p.m. Monday - Friday.  Please note that voicemails left after 4:00 p.m. may not be returned until the following business day.  We are closed weekends and major holidays.  You do have access to a nurse 24-7, just call the main number to the clinic [phone] and do not press any options, hold on the line and a nurse will answer the phone.    For prescription refill requests, have your  pharmacy contact our office and allow 72 hours.    Due to Covid, you will need to wear a mask upon entering the hospital. If you do not have a mask, a mask will be given to you at the Main Entrance upon arrival. For doctor visits, patients may have 1 support person age 74 or older with them. For treatment visits, patients can not have anyone with them due to social distancing guidelines and our immunocompromised population.

## 2021-06-09 LAB — BCR-ABL1 FISH
Cells Analyzed: 200
Cells Counted: 200

## 2021-06-28 ENCOUNTER — Other Ambulatory Visit (HOSPITAL_COMMUNITY): Payer: Self-pay | Admitting: *Deleted

## 2021-06-28 DIAGNOSIS — C9210 Chronic myeloid leukemia, BCR/ABL-positive, not having achieved remission: Secondary | ICD-10-CM

## 2021-06-28 MED ORDER — LORAZEPAM 1 MG PO TABS: 1.0000 mg | ORAL_TABLET | Freq: Three times a day (TID) | ORAL | 0 refills | Status: DC | PRN

## 2021-06-28 MED ORDER — HYDROCODONE-ACETAMINOPHEN 5-325 MG PO TABS: 1.0000 | ORAL_TABLET | Freq: Three times a day (TID) | ORAL | 0 refills | Status: DC | PRN

## 2021-08-08 ENCOUNTER — Other Ambulatory Visit (HOSPITAL_COMMUNITY): Payer: Self-pay

## 2021-08-08 ENCOUNTER — Inpatient Hospital Stay (HOSPITAL_COMMUNITY): Payer: 59 | Attending: Hematology | Admitting: Hematology

## 2021-08-08 ENCOUNTER — Inpatient Hospital Stay (HOSPITAL_COMMUNITY): Payer: 59

## 2021-08-08 ENCOUNTER — Other Ambulatory Visit: Payer: Self-pay

## 2021-08-08 VITALS — BP 127/87 | HR 108 | Resp 16 | Ht 72.0 in | Wt 235.4 lb

## 2021-08-08 DIAGNOSIS — C9210 Chronic myeloid leukemia, BCR/ABL-positive, not having achieved remission: Secondary | ICD-10-CM | POA: Diagnosis present

## 2021-08-08 DIAGNOSIS — E785 Hyperlipidemia, unspecified: Secondary | ICD-10-CM | POA: Diagnosis not present

## 2021-08-08 DIAGNOSIS — E789 Disorder of lipoprotein metabolism, unspecified: Secondary | ICD-10-CM

## 2021-08-08 DIAGNOSIS — R7401 Elevation of levels of liver transaminase levels: Secondary | ICD-10-CM | POA: Insufficient documentation

## 2021-08-08 DIAGNOSIS — Z7984 Long term (current) use of oral hypoglycemic drugs: Secondary | ICD-10-CM | POA: Diagnosis not present

## 2021-08-08 DIAGNOSIS — E039 Hypothyroidism, unspecified: Secondary | ICD-10-CM | POA: Diagnosis not present

## 2021-08-08 DIAGNOSIS — E099 Drug or chemical induced diabetes mellitus without complications: Secondary | ICD-10-CM | POA: Diagnosis not present

## 2021-08-08 DIAGNOSIS — E119 Type 2 diabetes mellitus without complications: Secondary | ICD-10-CM | POA: Diagnosis not present

## 2021-08-08 DIAGNOSIS — D696 Thrombocytopenia, unspecified: Secondary | ICD-10-CM | POA: Insufficient documentation

## 2021-08-08 LAB — COMPREHENSIVE METABOLIC PANEL
ALT: 35 U/L (ref 0–44)
AST: 111 U/L — ABNORMAL HIGH (ref 15–41)
Albumin: 3.8 g/dL (ref 3.5–5.0)
Alkaline Phosphatase: 100 U/L (ref 38–126)
Anion gap: 8 (ref 5–15)
BUN: 15 mg/dL (ref 6–20)
CO2: 22 mmol/L (ref 22–32)
Calcium: 8.3 mg/dL — ABNORMAL LOW (ref 8.9–10.3)
Chloride: 105 mmol/L (ref 98–111)
Creatinine, Ser: 0.75 mg/dL (ref 0.61–1.24)
GFR, Estimated: >60 mL/min (ref >60)
Glucose, Bld: 115 mg/dL — ABNORMAL HIGH (ref 70–99)
Potassium: 3.4 mmol/L — ABNORMAL LOW (ref 3.5–5.1)
Sodium: 135 mmol/L (ref 135–145)
Total Bilirubin: 1.5 mg/dL — ABNORMAL HIGH (ref 0.3–1.2)
Total Protein: 8.2 g/dL — ABNORMAL HIGH (ref 6.5–8.1)

## 2021-08-08 LAB — CBC WITH DIFFERENTIAL/PLATELET
Abs Immature Granulocytes: 0.01 10*3/uL (ref 0.00–0.07)
Basophils Absolute: 0.1 10*3/uL (ref 0.0–0.1)
Basophils Relative: 2 %
Eosinophils Absolute: 0 10*3/uL (ref 0.0–0.5)
Eosinophils Relative: 1 %
HCT: 36.3 % — ABNORMAL LOW (ref 39.0–52.0)
Hemoglobin: 12.2 g/dL — ABNORMAL LOW (ref 13.0–17.0)
Immature Granulocytes: 0 %
Lymphocytes Relative: 45 %
Lymphs Abs: 1.6 10*3/uL (ref 0.7–4.0)
MCH: 32.4 pg (ref 26.0–34.0)
MCHC: 33.6 g/dL (ref 30.0–36.0)
MCV: 96.3 fL (ref 80.0–100.0)
Monocytes Absolute: 0.4 10*3/uL (ref 0.1–1.0)
Monocytes Relative: 12 %
Neutro Abs: 1.4 10*3/uL — ABNORMAL LOW (ref 1.7–7.7)
Neutrophils Relative %: 40 %
Platelets: 59 10*3/uL — ABNORMAL LOW (ref 150–400)
RBC: 3.77 MIL/uL — ABNORMAL LOW (ref 4.22–5.81)
RDW: 17.2 % — ABNORMAL HIGH (ref 11.5–15.5)
WBC: 3.5 10*3/uL — ABNORMAL LOW (ref 4.0–10.5)
nRBC: 0 % (ref 0.0–0.2)

## 2021-08-08 LAB — LACTATE DEHYDROGENASE: LDH: 216 U/L — ABNORMAL HIGH (ref 98–192)

## 2021-08-08 LAB — TSH: TSH: 8.761 u[IU]/mL — ABNORMAL HIGH (ref 0.350–4.500)

## 2021-08-08 MED ORDER — LEVOTHYROXINE SODIUM 200 MCG PO TABS: ORAL_TABLET | ORAL | 3 refills | Status: DC

## 2021-08-08 NOTE — Progress Notes (Signed)
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    CANCER STAGING:  Cancer Staging  No matching staging information was found for the patient.  INTERVAL HISTORY:  Samuel Gonzales, a 56 y.o. 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 Samuel Gonzales was last seen on 06/06/2021.   Today he reports feeling well. He admits he occasionally misses doses of Synthroid. He reports pain in his wrists, elbows, and shoulders. He reports continued numbness in his feet. He denies fevers, infections, and easy bruising.   REVIEW OF SYSTEMS:  Review of Systems  Constitutional:  Positive for fatigue. Negative for appetite change and fever.  Respiratory:  Positive for cough.   Cardiovascular:  Positive for chest pain and palpitations.  Musculoskeletal:  Positive for arthralgias.  Neurological:  Positive for numbness.  Hematological:  Does not bruise/bleed easily.  All other systems reviewed and are negative.  PAST MEDICAL/SURGICAL HISTORY:  Past Medical History:  Diagnosis Date   Anxiety    Leukemia ([place])    Thyroid disease    No past surgical history on file.  SOCIAL HISTORY:  Social History   Socioeconomic History   Marital status: Married    Spouse name: Samuel Gonzales   Number of children: 1   Years of education: Not on file   Highest education level: High school graduate  Occupational History   Occupation: [employer]     CommentLoss adjuster, chartered  Tobacco Use   Smoking status: Never   Smokeless tobacco: Never  Vaping Use   Vaping Use: Never used  Substance and Sexual Activity   Alcohol use: Yes     Alcohol/week: 21.0 standard drinks    Types: 21 Cans of beer per week   Drug use: Not Currently   Sexual activity: Yes  Other Topics Concern   Not on file  Social History Narrative   Not on file   Social Determinants of Health   Financial Resource Strain: Not on file  Food Insecurity: Not on file  Transportation Needs: Not on file  Physical Activity: Not on file  Stress: Not on file  Social Connections: Not on file  Intimate Partner Violence: Not on file    FAMILY HISTORY:  Family History  Problem Relation Age of Onset   Cervical cancer Mother    Stroke Father     CURRENT MEDICATIONS:  Current Outpatient Medications  Medication Sig Dispense Refill   allopurinol (ZYLOPRIM) 300 MG tablet Take 300 mg by mouth daily.     butalbital-acetaminophen-caffeine (FIORICET) 50-325-40 MG tablet Take by mouth.     gabapentin (NEURONTIN) 300 MG capsule TAKE 1 CAPSULE BY MOUTH EVERYDAY AT BEDTIME 30 capsule 4   HYDROcodone-acetaminophen (NORCO/VICODIN) 5-325 MG tablet Take 1 tablet by mouth every 8 (eight) hours as needed. 90 tablet 0   LORazepam (ATIVAN) 1 MG tablet Take 1 tablet (1 mg total) by mouth 3 (three) times daily as needed for anxiety. 90 tablet 0   meloxicam (MOBIC) 15 MG tablet Take 1 tablet (15 mg total) by mouth daily. 30 tablet 1   predniSONE (STERAPRED UNI-PAK 21 TAB) 10 MG (21)  TBPK tablet Take as directed. 21 tablet 0   sitaGLIPtin (JANUVIA) 100 MG tablet Take 1 tablet (100 mg total) by mouth daily. 90 tablet 4   TASIGNA 150 MG capsule TAKE 1 CAPSULE BY MOUTH DAILY 84 capsule 0   levothyroxine (SYNTHROID) 200 MCG tablet TAKE 1 TABLET BY MOUTH DAILY BEFORE BREAKFAST 90 tablet 3   No current facility-administered medications for this visit.    ALLERGIES:  Allergies  Allergen Reactions   Levaquin [Levofloxacin]    Pecan Nut (Diagnostic)     PHYSICAL EXAM:  Performance status (ECOG): 0 - Asymptomatic  Vitals:   08/08/21 1136  BP: 127/87  Pulse: (!) 108  Resp:  16  SpO2: 96%   Wt Readings from Last 3 Encounters:  08/08/21 235 lb 6.4 oz (106.8 kg)  06/06/21 231 lb (104.8 kg)  04/10/21 234 lb 6.4 oz (106.3 kg)   Physical Exam Vitals reviewed.  Constitutional:      Appearance: Normal appearance. He is obese.  Cardiovascular:     Rate and Rhythm: Normal rate and regular rhythm.     Pulses: Normal pulses.     Heart sounds: Normal heart sounds.  Pulmonary:     Effort: Pulmonary effort is normal.     Breath sounds: Normal breath sounds.  Abdominal:     Palpations: Abdomen is soft. There is no hepatomegaly, splenomegaly or mass.     Tenderness: There is no abdominal tenderness.  Musculoskeletal:     Right lower leg: No edema.     Left lower leg: No edema.  Neurological:     General: No focal deficit present.     Mental Status: He is alert and oriented to person, place, and time.  Psychiatric:        Mood and Affect: Mood normal.        Behavior: Behavior normal.     LABORATORY DATA:  I have reviewed the labs as listed.  CBC Latest Ref Rng & Units 08/08/2021 06/06/2021 04/10/2021  WBC 4.0 - 10.5 K/uL 3.5(L) 3.8(L) 4.0  Hemoglobin 13.0 - 17.0 g/dL 12.2(L) 11.7(L) 12.1(L)  Hematocrit 39.0 - 52.0 % 36.3(L) 35.6(L) 38.0(L)  Platelets 150 - 400 K/uL 59(L) 53(L) 53(L)   CMP Latest Ref Rng & Units 08/08/2021 06/06/2021 04/10/2021  Glucose 70 - 99 mg/dL 115(H) 111(H) 123(H)  BUN 6 - 20 mg/dL 15 16 12   Creatinine 0.61 - 1.24 mg/dL 0.75 0.70 0.84  Sodium 135 - 145 mmol/L 135 136 138  Potassium 3.5 - 5.1 mmol/L 3.4(L) 3.6 3.9  Chloride 98 - 111 mmol/L 105 105 104  CO2 22 - 32 mmol/L 22 22 26   Calcium 8.9 - 10.3 mg/dL 8.3(L) 9.1 8.8(L)  Total Protein 6.5 - 8.1 g/dL 8.2(H) 8.1 8.4(H)  Total Bilirubin 0.3 - 1.2 mg/dL 1.5(H) 1.3(H) 1.1  Alkaline Phos 38 - 126 U/L 100 84 105  AST 15 - 41 U/L 111(H) 81(H) 151(H)  ALT 0 - 44 U/L 35 28 50(H)    DIAGNOSTIC IMAGING:  I have independently reviewed the scans and discussed with the patient. No results  found.   ASSESSMENT:  1.  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 -Nilotinib 300 mg twice daily started on July 05, 2012, dose reduced to 150 mg daily Monday through Friday in April 2015. -BCR/ABL by PCR has been negative since April 2017. - Nilotinib was discontinued on 02/13/2021.   2.  Thrombocytopenia: -He has mild to moderate thrombocytopenia  since the start of admit. -MRI of the abdomen on 05/20/2018 shows normal-sized spleen but cirrhotic liver configuration.  3.  Bilateral subdural hematoma: - He fell around [facility], developed right foot drop and headaches and right-sided weakness. - Evaluated at [facility], underwent burr hole craniotomy by Dr. Vevelyn Francois on 01/29/2021.  Required 2 units of platelets.   PLAN:  1.  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 - He had complete molecular response since 2017. - Last BCR/ABL on 04/10/2021 and 06/06/2021 were undetectable. - We have reviewed labs today which showed white count 3.5 and platelet count 59.  Hemoglobin is 12.2.  LDH was chronically elevated and stable at 216. - We have sent a BCR/ABL by quantitative PCR which is pending. - Physical examination did not reveal any palpable splenomegaly. - We will follow-up on BCR/ABL next week.  RTC 2 months for follow-up with repeat BCR/ABL.   2.  Thrombocytopenia: - Platelet count today is 59 and stable.  Likely from splenomegaly.   3.  Elevated LFTs: - This could be from underlying cirrhosis.  Today AST is 111 and total bilirubin is 1.5.  Counseled him to cut back on alcohol.   4.  Hypothyroidism: - He has missed some doses of Synthroid.  Continue Synthroid 150 mcg daily.  TSH today is 8.7, up from 0.2 at last visit.   5.  Hyperlipidemia: - He could not tolerate statins in the past.  Last lipid panel shows LDL 129 and total cholesterol 201. - Will repeat lipid panel next visit.   6.   Diabetes: - Continue Januvia 100 mg daily.  Last A1c was 5.9.  Will repeat A1c at next visit.      Orders placed this encounter:  No orders of the defined types were placed in this encounter.    Derek Jack, MD [place] [phone]   I, Thana Ates, am acting as a scribe for Dr. Derek Jack.  I, Derek Jack MD, have reviewed the above documentation for accuracy and completeness, and I agree with the above.

## 2021-08-08 NOTE — Patient Instructions (Signed)
[place] at [facility] Discharge Instructions   You were seen and examined today by Dr. Delton Coombes.  He reviewed the results of your lab work which is stable.  We will call you with the results of your leukemia test when it results next week. But your white count is remaining stable.  Continue to take Synthroid every day on an empty stomach.  Return as scheduled for lab work and office visit.    Thank you for choosing [place] at [facility] to provide your oncology and hematology care.  To afford each patient quality time with our provider, please arrive at least 15 minutes before your scheduled appointment time.   If you have a lab appointment with the [facility] please come in thru the Main Entrance and check in at the main information desk.  You need to re-schedule your appointment should you arrive 10 or more minutes late.  We strive to give you quality time with our providers, and arriving late affects you and other patients whose appointments are after yours.  Also, if you no show three or more times for appointments you may be dismissed from the clinic at the providers discretion.     Again, thank you for choosing [facility].  Our hope is that these requests will decrease the amount of time that you wait before being seen by our physicians.       _____________________________________________________________  Should you have questions after your visit to [facility], please contact our office at [phone] and follow the prompts.  Our office hours are 8:00 a.m. and 4:30 p.m. Monday - Friday.  Please note that voicemails left after 4:00 p.m. may not be returned until the following business day.  We are closed weekends and major holidays.  You do have access to a nurse 24-7, just call the main number to the clinic [phone] and do not press any options, hold on the line and a nurse will answer the phone.     For prescription refill requests, have your pharmacy contact our office and allow 72 hours.    Due to Covid, you will need to wear a mask upon entering the hospital. If you do not have a mask, a mask will be given to you at the Main Entrance upon arrival. For doctor visits, patients may have 1 support person age 42 or older with them. For treatment visits, patients can not have anyone with them due to social distancing guidelines and our immunocompromised population.

## 2021-08-09 MED ORDER — LORAZEPAM 1 MG PO TABS: 1.0000 mg | ORAL_TABLET | Freq: Three times a day (TID) | ORAL | 0 refills | Status: DC | PRN

## 2021-08-09 MED ORDER — HYDROCODONE-ACETAMINOPHEN 5-325 MG PO TABS: 1.0000 | ORAL_TABLET | Freq: Three times a day (TID) | ORAL | 0 refills | Status: DC | PRN

## 2021-08-14 ENCOUNTER — Encounter (HOSPITAL_COMMUNITY): Payer: Self-pay | Admitting: Emergency Medicine

## 2021-08-14 NOTE — Progress Notes (Signed)
FMLA completed for wife veronica.  Scanned to her email [email or web address].  Sent to him 08/14/2021.

## 2021-08-15 ENCOUNTER — Other Ambulatory Visit (HOSPITAL_COMMUNITY): Payer: Self-pay | Admitting: *Deleted

## 2021-08-15 DIAGNOSIS — C9210 Chronic myeloid leukemia, BCR/ABL-positive, not having achieved remission: Secondary | ICD-10-CM

## 2021-08-15 LAB — BCR-ABL1, CML/ALL, PCR, QUANT: Interpretation (BCRAL):: NEGATIVE

## 2021-08-15 MED ORDER — HYDROCODONE-ACETAMINOPHEN 5-300 MG PO TABS: 1.0000 | ORAL_TABLET | Freq: Three times a day (TID) | ORAL | 0 refills | Status: DC | PRN

## 2021-08-21 ENCOUNTER — Other Ambulatory Visit (HOSPITAL_COMMUNITY): Payer: Self-pay | Admitting: *Deleted

## 2021-08-21 MED ORDER — HYDROCODONE-ACETAMINOPHEN 5-300 MG PO TABS: 1.0000 | ORAL_TABLET | Freq: Three times a day (TID) | ORAL | 0 refills | Status: DC | PRN

## 2021-08-22 ENCOUNTER — Other Ambulatory Visit (HOSPITAL_COMMUNITY): Payer: Self-pay

## 2021-08-22 DIAGNOSIS — C9210 Chronic myeloid leukemia, BCR/ABL-positive, not having achieved remission: Secondary | ICD-10-CM

## 2021-08-22 MED ORDER — HYDROCODONE-ACETAMINOPHEN 5-325 MG PO TABS: 1.0000 | ORAL_TABLET | Freq: Three times a day (TID) | ORAL | 0 refills | Status: DC | PRN

## 2021-08-25 ENCOUNTER — Encounter (HOSPITAL_COMMUNITY): Payer: Self-pay | Admitting: *Deleted

## 2021-09-14 ENCOUNTER — Other Ambulatory Visit (HOSPITAL_COMMUNITY): Payer: Self-pay | Admitting: *Deleted

## 2021-09-14 DIAGNOSIS — C9210 Chronic myeloid leukemia, BCR/ABL-positive, not having achieved remission: Secondary | ICD-10-CM

## 2021-09-14 MED ORDER — HYDROCODONE-ACETAMINOPHEN 5-325 MG PO TABS: 1.0000 | ORAL_TABLET | Freq: Three times a day (TID) | ORAL | 0 refills | Status: DC | PRN

## 2021-09-14 MED ORDER — LORAZEPAM 1 MG PO TABS: 1.0000 mg | ORAL_TABLET | Freq: Three times a day (TID) | ORAL | 5 refills | Status: DC | PRN

## 2021-09-14 NOTE — Telephone Encounter (Signed)
Ativan and Hydrocone refill request sent to Dr. Delton Coombes for approval.  Patients wife Sharyn Lull notified of this and to check in with the Pharmacy later this evening. ?

## 2021-09-14 NOTE — Telephone Encounter (Signed)
I will call her once it is completed.

## 2021-09-22 ENCOUNTER — Other Ambulatory Visit (HOSPITAL_COMMUNITY): Payer: Self-pay | Admitting: Hematology

## 2021-10-06 ENCOUNTER — Other Ambulatory Visit (HOSPITAL_COMMUNITY): Payer: Self-pay

## 2021-10-10 ENCOUNTER — Other Ambulatory Visit (HOSPITAL_COMMUNITY): Payer: 59

## 2021-10-10 ENCOUNTER — Ambulatory Visit (HOSPITAL_COMMUNITY): Payer: 59 | Admitting: Hematology

## 2021-10-17 ENCOUNTER — Inpatient Hospital Stay (HOSPITAL_COMMUNITY): Payer: 59

## 2021-10-17 ENCOUNTER — Encounter (HOSPITAL_COMMUNITY): Payer: Self-pay | Admitting: Hematology

## 2021-10-17 ENCOUNTER — Inpatient Hospital Stay (HOSPITAL_COMMUNITY): Payer: 59 | Attending: Hematology | Admitting: Hematology

## 2021-10-17 VITALS — BP 123/75 | HR 112 | Temp 98.0°F | Resp 18 | Ht 71.0 in | Wt 231.8 lb

## 2021-10-17 DIAGNOSIS — E119 Type 2 diabetes mellitus without complications: Secondary | ICD-10-CM | POA: Diagnosis not present

## 2021-10-17 DIAGNOSIS — R7401 Elevation of levels of liver transaminase levels: Secondary | ICD-10-CM | POA: Insufficient documentation

## 2021-10-17 DIAGNOSIS — E789 Disorder of lipoprotein metabolism, unspecified: Secondary | ICD-10-CM

## 2021-10-17 DIAGNOSIS — Z7984 Long term (current) use of oral hypoglycemic drugs: Secondary | ICD-10-CM | POA: Diagnosis not present

## 2021-10-17 DIAGNOSIS — E038 Other specified hypothyroidism: Secondary | ICD-10-CM

## 2021-10-17 DIAGNOSIS — E785 Hyperlipidemia, unspecified: Secondary | ICD-10-CM | POA: Diagnosis not present

## 2021-10-17 DIAGNOSIS — E099 Drug or chemical induced diabetes mellitus without complications: Secondary | ICD-10-CM

## 2021-10-17 DIAGNOSIS — E039 Hypothyroidism, unspecified: Secondary | ICD-10-CM | POA: Diagnosis not present

## 2021-10-17 DIAGNOSIS — D696 Thrombocytopenia, unspecified: Secondary | ICD-10-CM | POA: Insufficient documentation

## 2021-10-17 DIAGNOSIS — C9210 Chronic myeloid leukemia, BCR/ABL-positive, not having achieved remission: Secondary | ICD-10-CM

## 2021-10-17 LAB — CBC WITH DIFFERENTIAL/PLATELET
Abs Immature Granulocytes: 0 10*3/uL (ref 0.00–0.07)
Basophils Absolute: 0.1 10*3/uL (ref 0.0–0.1)
Basophils Relative: 3 %
Eosinophils Absolute: 0 10*3/uL (ref 0.0–0.5)
Eosinophils Relative: 0 %
HCT: 35.4 % — ABNORMAL LOW (ref 39.0–52.0)
Hemoglobin: 11.8 g/dL — ABNORMAL LOW (ref 13.0–17.0)
Immature Granulocytes: 0 %
Lymphocytes Relative: 47 %
Lymphs Abs: 1.5 10*3/uL (ref 0.7–4.0)
MCH: 31.9 pg (ref 26.0–34.0)
MCHC: 33.3 g/dL (ref 30.0–36.0)
MCV: 95.7 fL (ref 80.0–100.0)
Monocytes Absolute: 0.4 10*3/uL (ref 0.1–1.0)
Monocytes Relative: 12 %
Neutro Abs: 1.2 10*3/uL — ABNORMAL LOW (ref 1.7–7.7)
Neutrophils Relative %: 38 %
Platelets: 50 10*3/uL — ABNORMAL LOW (ref 150–400)
RBC: 3.7 MIL/uL — ABNORMAL LOW (ref 4.22–5.81)
RDW: 15.6 % — ABNORMAL HIGH (ref 11.5–15.5)
WBC: 3.2 10*3/uL — ABNORMAL LOW (ref 4.0–10.5)
nRBC: 0 % (ref 0.0–0.2)

## 2021-10-17 LAB — COMPREHENSIVE METABOLIC PANEL
ALT: 34 U/L (ref 0–44)
AST: 125 U/L — ABNORMAL HIGH (ref 15–41)
Albumin: 3.7 g/dL (ref 3.5–5.0)
Alkaline Phosphatase: 81 U/L (ref 38–126)
Anion gap: 10 (ref 5–15)
BUN: 15 mg/dL (ref 6–20)
CO2: 25 mmol/L (ref 22–32)
Calcium: 8.9 mg/dL (ref 8.9–10.3)
Chloride: 107 mmol/L (ref 98–111)
Creatinine, Ser: 0.79 mg/dL (ref 0.61–1.24)
GFR, Estimated: >60 mL/min (ref >60)
Glucose, Bld: 110 mg/dL — ABNORMAL HIGH (ref 70–99)
Potassium: 3.8 mmol/L (ref 3.5–5.1)
Sodium: 142 mmol/L (ref 135–145)
Total Bilirubin: 1.3 mg/dL — ABNORMAL HIGH (ref 0.3–1.2)
Total Protein: 8.3 g/dL — ABNORMAL HIGH (ref 6.5–8.1)

## 2021-10-17 LAB — TRIGLYCERIDES: Triglycerides: 99 mg/dL (ref <150)

## 2021-10-17 LAB — LIPID PANEL
Cholesterol: 174 mg/dL (ref 0–200)
HDL: 42 mg/dL (ref >40)
LDL Cholesterol: 115 mg/dL — ABNORMAL HIGH (ref 0–99)
Total CHOL/HDL Ratio: 4.1 RATIO
Triglycerides: 84 mg/dL (ref <150)
VLDL: 17 mg/dL (ref 0–40)

## 2021-10-17 LAB — HEMOGLOBIN A1C
Hgb A1c MFr Bld: 5.6 % (ref 4.8–5.6)
Mean Plasma Glucose: 114.02 mg/dL

## 2021-10-17 LAB — TSH: TSH: 1.817 u[IU]/mL (ref 0.350–4.500)

## 2021-10-17 LAB — LACTATE DEHYDROGENASE: LDH: 219 U/L — ABNORMAL HIGH (ref 98–192)

## 2021-10-17 NOTE — Patient Instructions (Addendum)
[place] at [facility] ?Discharge Instructions ? ? ?You were seen and examined today by Dr. Delton Coombes. ? ?He reviewed your lab work which is normal/stable. Your CLL blood work is pending.  ? ?Return as scheduled in 3 months.  ? ? ? ? ?Thank you for choosing [place] at [facility] to provide your oncology and hematology care.  To afford each patient quality time with our provider, please arrive at least 15 minutes before your scheduled appointment time.  ? ?If you have a lab appointment with the [place] please come in thru the Main Entrance and check in at the main information desk. ? ?You need to re-schedule your appointment should you arrive 10 or more minutes late.  We strive to give you quality time with our providers, and arriving late affects you and other patients whose appointments are after yours.  Also, if you no show three or more times for appointments you may be dismissed from the clinic at the providers discretion.     ?Again, thank you for choosing [facility].  Our hope is that these requests will decrease the amount of time that you wait before being seen by our physicians.       ?_____________________________________________________________ ? ?Should you have questions after your visit to [facility], please contact our office at [phone] and follow the prompts.  Our office hours are 8:00 a.m. and 4:30 p.m. Monday - Friday.  Please note that voicemails left after 4:00 p.m. may not be returned until the following business day.  We are closed weekends and major holidays.  You do have access to a nurse 24-7, just call the main number to the clinic [phone] and do not press any options, hold on the line and a nurse will answer the phone.   ? ?For prescription refill requests, have your pharmacy contact our office and allow 72 hours.   ? ?Due to Covid, you will need to wear a mask upon entering the hospital. If  you do not have a mask, a mask will be given to you at the Main Entrance upon arrival. For doctor visits, patients may have 1 support person age 45 or older with them. For treatment visits, patients can not have anyone with them due to social distancing guidelines and our immunocompromised population.  ? ?   ?

## 2021-10-17 NOTE — Progress Notes (Signed)
? ?[place] ?[address] ?[place], [place] [address] ? ? ?CLINIC:  ?Medical Oncology/Hematology ? ?PCP:  ?Eber Hong, MD ?[place] / [place] [address]  ?[phone] ? 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 ?PRIOR THERAPY: none ? ?CURRENT THERAPY: surveillance ? ?INTERVAL HISTORY:  ?Samuel Gonzales, a 56 y.o. 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 Samuel Gonzales was last seen on 08/08/2021. ? ?Today he reports feeling good. His appetite is good. He rates his energy 50% form his baseline. He reports constant painful numbness in his toes and pain in his arms, shoulders, and knees. He denies burning pain and numbness in his fingertips. He previously took Gabapentin which did not help numbness. He is taking Synthroid and denies missing doses. He reports nosebleeds occurring at night and bleeding at his gums which he reports has occurred for a "long time". He takes hydrocodone prn at least once daily.   ? ?REVIEW OF SYSTEMS:  ?Review of Systems  ?Constitutional:  Positive for fatigue (50%). Negative for appetite change.  ?HENT:   Positive for nosebleeds.   ?Musculoskeletal:  Positive for arthralgias (arms, shoulders, and knees).  ?Neurological:  Positive for numbness (toes).  ?All other systems reviewed and are negative. ? ?PAST MEDICAL/SURGICAL HISTORY:  ?Past Medical History:  ?Diagnosis Date  ? Anxiety   ? Leukemia ([place])   ? Thyroid disease   ? ?History reviewed. No pertinent surgical history. ? ?SOCIAL HISTORY:  ?Social History  ? ?Socioeconomic History  ? Marital status: Married  ?  Spouse name: Samuel Gonzales  ? Number of children: 1  ? Years of education: Not on file  ? Highest education level: High school graduate  ?Occupational History  ? Occupation: [employer]   ?  Comment: printer  ?Tobacco Use  ? Smoking status: Never  ? Smokeless tobacco: Never  ?Vaping Use  ? Vaping Use: Never used  ?Substance and Sexual Activity  ? Alcohol use:  Yes  ?  Alcohol/week: 21.0 standard drinks  ?  Types: 21 Cans of beer per week  ? Drug use: Not Currently  ? Sexual activity: Yes  ?Other Topics Concern  ? Not on file  ?Social History Narrative  ? Not on file  ? ?Social Determinants of Health  ? ?Financial Resource Strain: Not on file  ?Food Insecurity: Not on file  ?Transportation Needs: Not on file  ?Physical Activity: Not on file  ?Stress: Not on file  ?Social Connections: Not on file  ?Intimate Partner Violence: Not on file  ? ? ?FAMILY HISTORY:  ?Family History  ?Problem Relation Age of Onset  ? Cervical cancer Mother   ? Stroke Father   ? ? ?CURRENT MEDICATIONS:  ?Current Outpatient Medications  ?Medication Sig Dispense Refill  ? allopurinol (ZYLOPRIM) 300 MG tablet Take 300 mg by mouth daily.    ? butalbital-acetaminophen-caffeine (FIORICET) 50-325-40 MG tablet Take by mouth.    ? gabapentin (NEURONTIN) 300 MG capsule TAKE 1 CAPSULE BY MOUTH EVERYDAY AT BEDTIME 30 capsule 4  ? HYDROcodone-acetaminophen (NORCO/VICODIN) 5-325 MG tablet Take 1 tablet by mouth every 8 (eight) hours as needed. 90 tablet 0  ? levothyroxine (SYNTHROID) 200 MCG tablet TAKE 1 TABLET BY MOUTH DAILY BEFORE BREAKFAST 90 tablet 3  ? LORazepam (ATIVAN) 1 MG tablet Take 1 tablet (1 mg total) by mouth 3 (three) times daily as needed for anxiety. 90 tablet 5  ? predniSONE (STERAPRED UNI-PAK 21 TAB) 10 MG (  21) TBPK tablet Take as directed. 21 tablet 0  ? sitaGLIPtin (JANUVIA) 100 MG tablet Take 1 tablet (100 mg total) by mouth daily. 90 tablet 4  ? TASIGNA 150 MG capsule TAKE 1 CAPSULE BY MOUTH DAILY 84 capsule 0  ? ?No current facility-administered medications for this visit.  ? ? ?ALLERGIES:  ?Allergies  ?Allergen Reactions  ? Levaquin [Levofloxacin]   ? Pecan Nut (Diagnostic)   ? ? ?PHYSICAL EXAM:  ?Performance status (ECOG): 0 - Asymptomatic ? ?Vitals:  ? 10/17/21 1127  ?BP: 123/75  ?Pulse: (!) 112  ?Resp: 18  ?Temp: 98 ?F (36.7 ?C)  ?SpO2: 99%  ? ?Wt Readings from Last 3 Encounters:   ?10/17/21 231 lb 12.8 oz (105.1 kg)  ?08/08/21 235 lb 6.4 oz (106.8 kg)  ?06/06/21 231 lb (104.8 kg)  ? ?Physical Exam ?Vitals reviewed.  ?Constitutional:   ?   Appearance: Normal appearance. He is obese.  ?HENT:  ?   Mouth/Throat:  ?   Mouth: No oral lesions.  ?Cardiovascular:  ?   Rate and Rhythm: Normal rate and regular rhythm.  ?   Pulses: Normal pulses.  ?   Heart sounds: Normal heart sounds.  ?Pulmonary:  ?   Effort: Pulmonary effort is normal.  ?   Breath sounds: Normal breath sounds.  ?Abdominal:  ?   Palpations: Abdomen is soft. There is no hepatomegaly, splenomegaly or mass.  ?   Tenderness: There is no abdominal tenderness.  ?Musculoskeletal:  ?   Right lower leg: No edema.  ?   Left lower leg: No edema.  ?Neurological:  ?   General: No focal deficit present.  ?   Mental Status: He is alert and oriented to person, place, and time.  ?Psychiatric:     ?   Mood and Affect: Mood normal.     ?   Behavior: Behavior normal.  ? ? ?LABORATORY DATA:  ?I have reviewed the labs as listed.  ? ?  Latest Ref Rng & Units 10/17/2021  ?  9:39 AM 08/08/2021  ? 10:39 AM 06/06/2021  ? 10:33 AM  ?CBC  ?WBC 4.0 - 10.5 K/uL 3.2   3.5   3.8    ?Hemoglobin 13.0 - 17.0 g/dL 11.8   12.2   11.7    ?Hematocrit 39.0 - 52.0 % 35.4   36.3   35.6    ?Platelets 150 - 400 K/uL 50   59   53    ? ? ?  Latest Ref Rng & Units 10/17/2021  ?  9:39 AM 08/08/2021  ? 10:39 AM 06/06/2021  ? 10:33 AM  ?CMP  ?Glucose 70 - 99 mg/dL 110   115   111    ?BUN 6 - 20 mg/dL '15   15   16    '$ ?Creatinine 0.61 - 1.24 mg/dL 0.79   0.75   0.70    ?Sodium 135 - 145 mmol/L 142   135   136    ?Potassium 3.5 - 5.1 mmol/L 3.8   3.4   3.6    ?Chloride 98 - 111 mmol/L 107   105   105    ?CO2 22 - 32 mmol/L '25   22   22    '$ ?Calcium 8.9 - 10.3 mg/dL 8.9   8.3   9.1    ?Total Protein 6.5 - 8.1 g/dL 8.3   8.2   8.1    ?Total Bilirubin 0.3 - 1.2 mg/dL 1.3   1.5   1.3    ?  Alkaline Phos 38 - 126 U/L 81   100   84    ?AST 15 - 41 U/L 125   111   81    ?ALT 0 - 44 U/L 34   35   28     ? ?   ?Component Value Date/Time  ? RBC 3.70 (L) 10/17/2021 0939  ? MCV 95.7 10/17/2021 0939  ? MCH 31.9 10/17/2021 0939  ? MCHC 33.3 10/17/2021 0939  ? RDW 15.6 (H) 10/17/2021 6195  ? LYMPHSABS 1.5 10/17/2021 0939  ? MONOABS 0.4 10/17/2021 0939  ? EOSABS 0.0 10/17/2021 0939  ? BASOSABS 0.1 10/17/2021 0939  ? ? ?DIAGNOSTIC IMAGING:  ?I have independently reviewed the scans and discussed with the patient. ?No results found.  ? ?ASSESSMENT:  ?1.  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 ?-Nilotinib 300 mg twice daily started on July 05, 2012, dose reduced to 150 mg daily Monday through Friday in April 2015. ?-BCR/ABL by PCR has been negative since April 2017. ?- Nilotinib was discontinued on 02/13/2021. ?  ?2.  Thrombocytopenia: ?-He has mild to moderate thrombocytopenia since the start of admit. ?-MRI of the abdomen on 05/20/2018 shows normal-sized spleen but cirrhotic liver configuration. ?  ?3.  Bilateral subdural hematoma: ?- He fell around [facility], developed right foot drop and headaches and right-sided weakness. ?- Evaluated at [facility], underwent burr hole craniotomy by Dr. Vevelyn Francois on 01/29/2021.  Required 2 units of platelets. ? ? ?PLAN:  ?1.  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 ?- BCR/ABL on 08/08/2021 was undetectable. ?- Physical examination today did not reveal any palpable splenomegaly.  He does not have any B symptoms. ?- He reports numbness in the feet and toes worse at nighttime.  No numbness in the fingertips.  He tried gabapentin and could not tolerate.  Does not report any neuropathic pains. ?- He does report worsening of joint pains in the arms shoulders and knees since Nilotinib was discontinued. ?- Continue hydrocodone 5/325 2-3 times a day as needed for arthritic pains. ?- We will follow-up on BCR/ABL from today.  RTC 3 months with repeat labs. ?  ?2.  Thrombocytopenia: ?-  Platelet count is 50 and stable.  He has occasional nosebleeds which are easily controlled.  Avoid taking NSAIDs. ?  ?3.  Elevated LFTs: ?- Mildly elevated AST of 125 is stable.  Bilirubin is 1.3.  This is from

## 2021-10-18 ENCOUNTER — Telehealth (HOSPITAL_COMMUNITY): Payer: Self-pay | Admitting: *Deleted

## 2021-10-18 NOTE — Telephone Encounter (Signed)
Called to advise, per Dr. Delton Coombes that TSH was normal and to continue synthroid at current dose.  Spoke to wife Sharyn Lull. ?

## 2021-10-23 ENCOUNTER — Other Ambulatory Visit (HOSPITAL_COMMUNITY): Payer: Self-pay | Admitting: Hematology

## 2021-10-23 ENCOUNTER — Other Ambulatory Visit (HOSPITAL_COMMUNITY): Payer: Self-pay | Admitting: *Deleted

## 2021-10-23 DIAGNOSIS — C9210 Chronic myeloid leukemia, BCR/ABL-positive, not having achieved remission: Secondary | ICD-10-CM

## 2021-10-23 LAB — BCR-ABL1, CML/ALL, PCR, QUANT: Interpretation (BCRAL):: NEGATIVE

## 2021-10-23 MED ORDER — HYDROCODONE-ACETAMINOPHEN 5-325 MG PO TABS: 1.0000 | ORAL_TABLET | Freq: Three times a day (TID) | ORAL | 0 refills | Status: DC | PRN

## 2021-11-02 ENCOUNTER — Encounter (HOSPITAL_COMMUNITY): Payer: Self-pay

## 2021-11-02 NOTE — Progress Notes (Signed)
Spoke with patient's dentist, Dr. Lorie Phenix at [phone] regarding patient's ongoing dental care. Dr. Lorie Phenix states that patient needs a deep cleaning but experienced bleeding at his last appt. Discussed with Dr. Delton Coombes who advises that Dr. Lorie Phenix can clear 1/4 of the mouth at a time. Platelets stable at 50, and no treatment indicated unless platelets are less than 50. Dr. Lorie Phenix advised of Dr. Tomie China recommendation and verbalized understanding. Date of last and next platelet count check provided, advised that if he needs an additional check for her to proceed with can arrange that as well. She will call back if she requests this. ?

## 2021-11-27 ENCOUNTER — Other Ambulatory Visit (HOSPITAL_COMMUNITY): Payer: Self-pay | Admitting: *Deleted

## 2021-11-27 DIAGNOSIS — C9210 Chronic myeloid leukemia, BCR/ABL-positive, not having achieved remission: Secondary | ICD-10-CM

## 2021-11-27 MED ORDER — HYDROCODONE-ACETAMINOPHEN 5-325 MG PO TABS: 1.0000 | ORAL_TABLET | Freq: Three times a day (TID) | ORAL | 0 refills | Status: DC | PRN

## 2021-12-26 ENCOUNTER — Other Ambulatory Visit (HOSPITAL_COMMUNITY): Payer: Self-pay | Admitting: *Deleted

## 2021-12-26 DIAGNOSIS — C9210 Chronic myeloid leukemia, BCR/ABL-positive, not having achieved remission: Secondary | ICD-10-CM

## 2021-12-26 MED ORDER — HYDROCODONE-ACETAMINOPHEN 5-325 MG PO TABS: 1.0000 | ORAL_TABLET | Freq: Three times a day (TID) | ORAL | 0 refills | Status: DC | PRN

## 2021-12-27 ENCOUNTER — Other Ambulatory Visit (HOSPITAL_COMMUNITY): Payer: Self-pay | Admitting: *Deleted

## 2021-12-27 DIAGNOSIS — C9210 Chronic myeloid leukemia, BCR/ABL-positive, not having achieved remission: Secondary | ICD-10-CM

## 2021-12-27 MED ORDER — HYDROCODONE-ACETAMINOPHEN 5-325 MG PO TABS: 1.0000 | ORAL_TABLET | Freq: Three times a day (TID) | ORAL | 0 refills | Status: DC | PRN

## 2022-01-10 IMAGING — DX DG SHOULDER 2+V*L*
3 series · 3 of 3 positions shown · non-contrast
Comparison: None

CLINICAL DATA: LEFT shoulder plan select plan pain

EXAM:
LEFT SHOULDER - 2+ VIEW

[shoulder grashey]
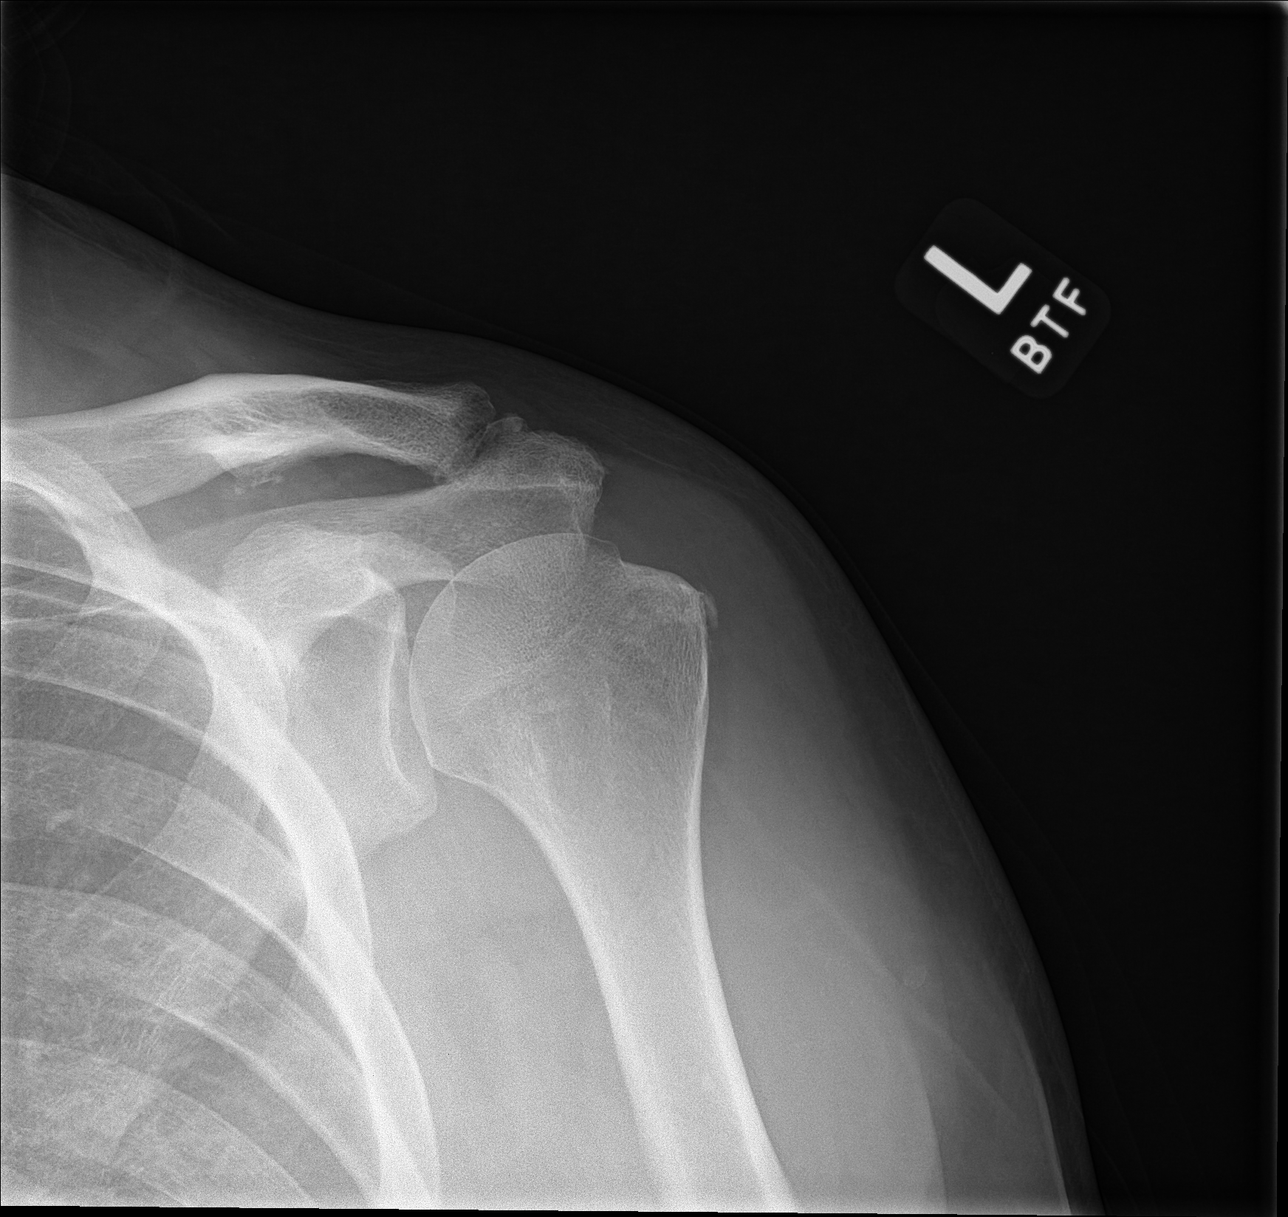

[shoulder y view]
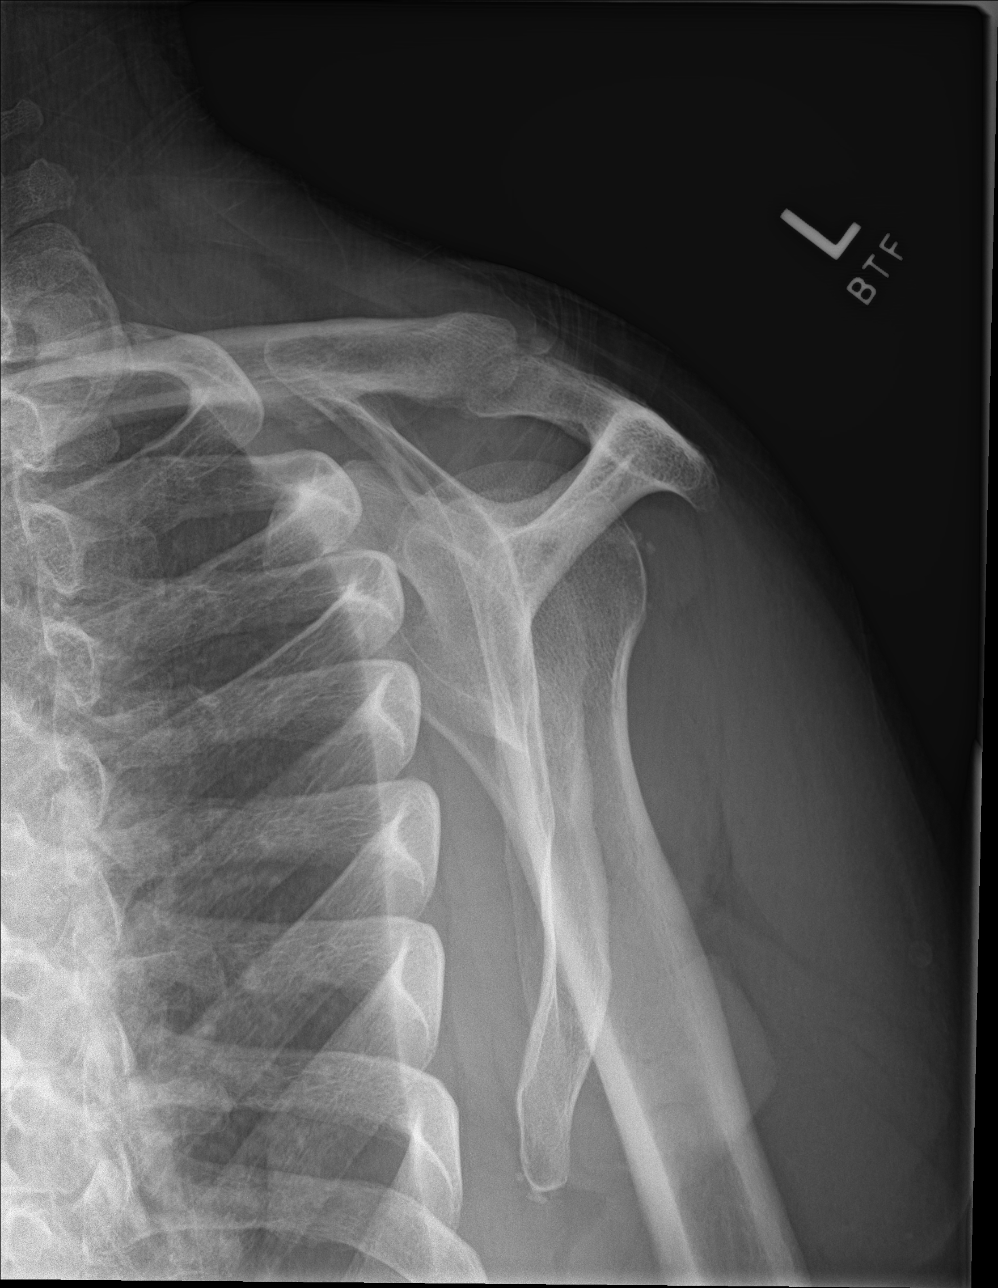

[shoulder axillary]
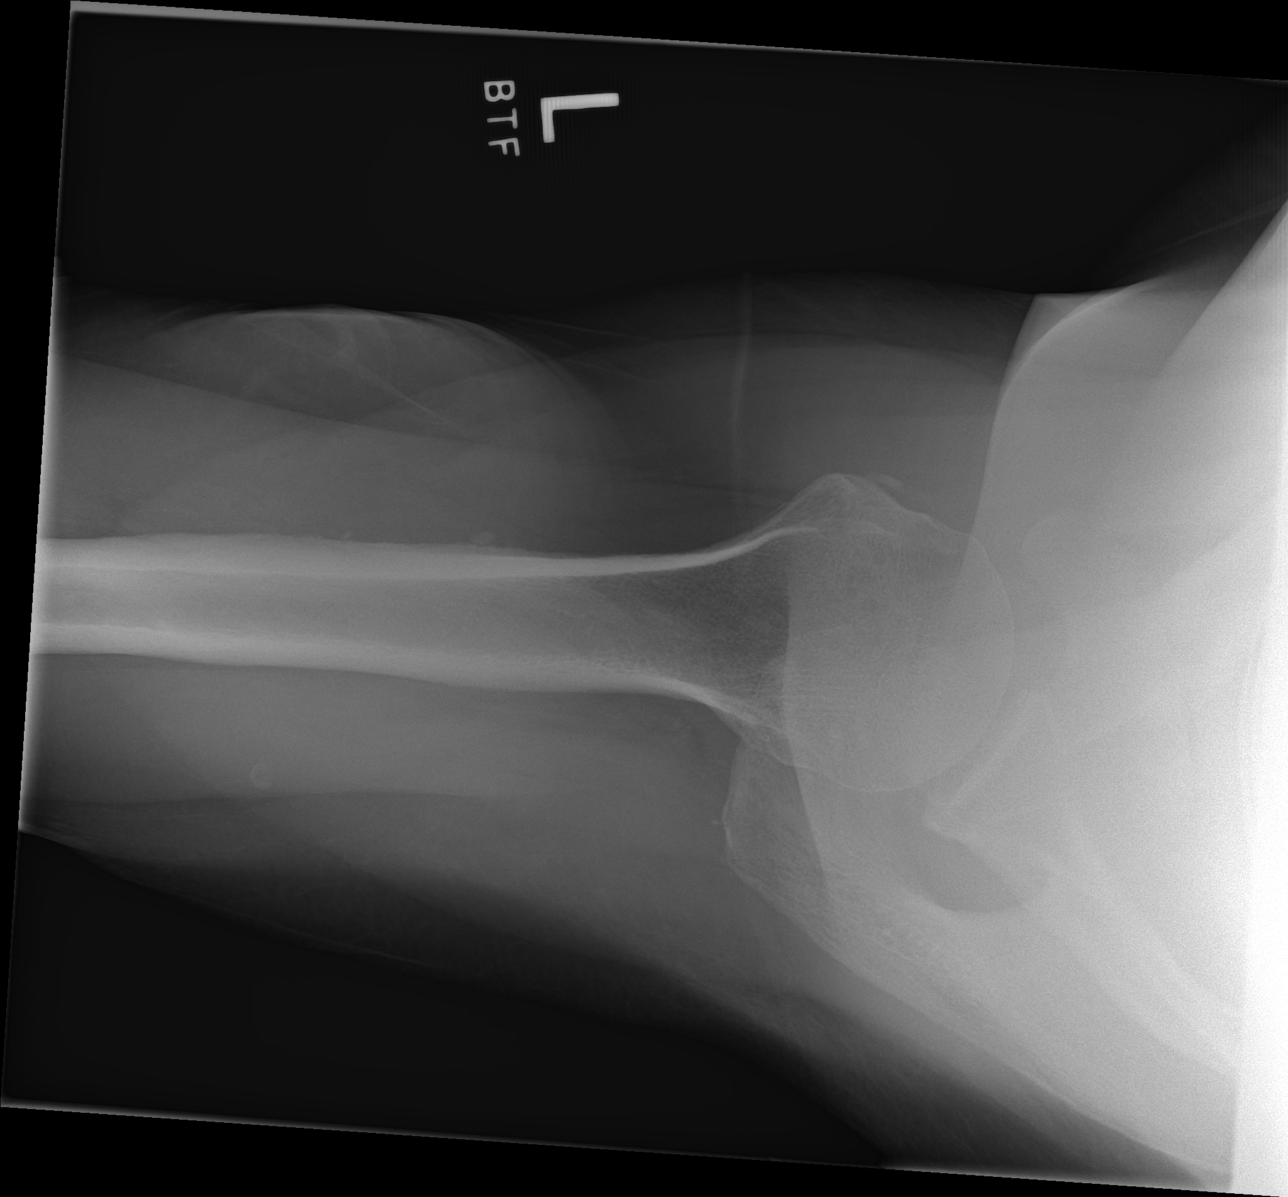

[3 of 3 positions shown; findings below may reference images not displayed]

FINDINGS: No acute fracture or dislocation. Acromioclavicular and glenohumeral
degenerative changes. Signs of calcific tendinopathy is of the
rotator cuff.

Mild irregularity about the undersurface of the clavicle may reflect
prior coracoclavicular ligament injury.
IMPRESSION: 1. Signs of calcific tendinopathy of the rotator cuff and
degenerative changes about the shoulder.
2. Question of prior coracoclavicular ligament injury.
3. Correlate with any signs of shoulder instability.

## 2022-01-16 ENCOUNTER — Other Ambulatory Visit (HOSPITAL_COMMUNITY): Payer: 59

## 2022-01-16 ENCOUNTER — Ambulatory Visit (HOSPITAL_COMMUNITY): Payer: 59 | Admitting: Hematology

## 2022-01-30 ENCOUNTER — Other Ambulatory Visit (HOSPITAL_COMMUNITY): Payer: Self-pay | Admitting: *Deleted

## 2022-01-30 DIAGNOSIS — C9210 Chronic myeloid leukemia, BCR/ABL-positive, not having achieved remission: Secondary | ICD-10-CM

## 2022-01-30 MED ORDER — HYDROCODONE-ACETAMINOPHEN 5-325 MG PO TABS: 1.0000 | ORAL_TABLET | Freq: Three times a day (TID) | ORAL | 0 refills | Status: DC | PRN

## 2022-02-01 ENCOUNTER — Inpatient Hospital Stay (HOSPITAL_COMMUNITY): Payer: 59 | Attending: Hematology

## 2022-02-01 ENCOUNTER — Inpatient Hospital Stay (HOSPITAL_COMMUNITY): Payer: 59 | Admitting: Hematology

## 2022-02-01 VITALS — BP 146/78 | HR 133 | Temp 98.8°F | Resp 18 | Ht 72.0 in | Wt 236.2 lb

## 2022-02-01 DIAGNOSIS — Z7984 Long term (current) use of oral hypoglycemic drugs: Secondary | ICD-10-CM | POA: Diagnosis not present

## 2022-02-01 DIAGNOSIS — C9210 Chronic myeloid leukemia, BCR/ABL-positive, not having achieved remission: Secondary | ICD-10-CM

## 2022-02-01 DIAGNOSIS — E038 Other specified hypothyroidism: Secondary | ICD-10-CM

## 2022-02-01 DIAGNOSIS — E785 Hyperlipidemia, unspecified: Secondary | ICD-10-CM | POA: Diagnosis not present

## 2022-02-01 DIAGNOSIS — R5383 Other fatigue: Secondary | ICD-10-CM | POA: Diagnosis not present

## 2022-02-01 DIAGNOSIS — E119 Type 2 diabetes mellitus without complications: Secondary | ICD-10-CM | POA: Insufficient documentation

## 2022-02-01 DIAGNOSIS — D696 Thrombocytopenia, unspecified: Secondary | ICD-10-CM | POA: Diagnosis not present

## 2022-02-01 DIAGNOSIS — E039 Hypothyroidism, unspecified: Secondary | ICD-10-CM | POA: Diagnosis not present

## 2022-02-01 DIAGNOSIS — R7401 Elevation of levels of liver transaminase levels: Secondary | ICD-10-CM | POA: Insufficient documentation

## 2022-02-01 LAB — COMPREHENSIVE METABOLIC PANEL
ALT: 42 U/L (ref 0–44)
AST: 133 U/L — ABNORMAL HIGH (ref 15–41)
Albumin: 3.4 g/dL — ABNORMAL LOW (ref 3.5–5.0)
Alkaline Phosphatase: 62 U/L (ref 38–126)
Anion gap: 10 (ref 5–15)
BUN: 28 mg/dL — ABNORMAL HIGH (ref 6–20)
CO2: 21 mmol/L — ABNORMAL LOW (ref 22–32)
Calcium: 8.7 mg/dL — ABNORMAL LOW (ref 8.9–10.3)
Chloride: 109 mmol/L (ref 98–111)
Creatinine, Ser: 0.86 mg/dL (ref 0.61–1.24)
GFR, Estimated: >60 mL/min (ref >60)
Glucose, Bld: 138 mg/dL — ABNORMAL HIGH (ref 70–99)
Potassium: 3.6 mmol/L (ref 3.5–5.1)
Sodium: 140 mmol/L (ref 135–145)
Total Bilirubin: 1.4 mg/dL — ABNORMAL HIGH (ref 0.3–1.2)
Total Protein: 7.3 g/dL (ref 6.5–8.1)

## 2022-02-01 LAB — IRON AND TIBC
Iron: 169 ug/dL (ref 45–182)
Saturation Ratios: 35 % (ref 17.9–39.5)
TIBC: 483 ug/dL — ABNORMAL HIGH (ref 250–450)
UIBC: 314 ug/dL

## 2022-02-01 LAB — CBC WITH DIFFERENTIAL/PLATELET
Abs Immature Granulocytes: 0.01 10*3/uL (ref 0.00–0.07)
Basophils Absolute: 0.1 10*3/uL (ref 0.0–0.1)
Basophils Relative: 2 %
Eosinophils Absolute: 0 10*3/uL (ref 0.0–0.5)
Eosinophils Relative: 0 %
HCT: 27.9 % — ABNORMAL LOW (ref 39.0–52.0)
Hemoglobin: 8.9 g/dL — ABNORMAL LOW (ref 13.0–17.0)
Immature Granulocytes: 0 %
Lymphocytes Relative: 37 %
Lymphs Abs: 1.5 10*3/uL (ref 0.7–4.0)
MCH: 31 pg (ref 26.0–34.0)
MCHC: 31.9 g/dL (ref 30.0–36.0)
MCV: 97.2 fL (ref 80.0–100.0)
Monocytes Absolute: 0.4 10*3/uL (ref 0.1–1.0)
Monocytes Relative: 11 %
Neutro Abs: 2 10*3/uL (ref 1.7–7.7)
Neutrophils Relative %: 50 %
Platelets: 55 10*3/uL — ABNORMAL LOW (ref 150–400)
RBC: 2.87 MIL/uL — ABNORMAL LOW (ref 4.22–5.81)
RDW: 17.9 % — ABNORMAL HIGH (ref 11.5–15.5)
WBC: 4.1 10*3/uL (ref 4.0–10.5)
nRBC: 0 % (ref 0.0–0.2)

## 2022-02-01 LAB — LACTATE DEHYDROGENASE: LDH: 185 U/L (ref 98–192)

## 2022-02-01 LAB — TSH: TSH: 3.139 u[IU]/mL (ref 0.350–4.500)

## 2022-02-01 LAB — FERRITIN: Ferritin: 78 ng/mL (ref 24–336)

## 2022-02-01 NOTE — Progress Notes (Signed)
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, a 56 y.o. 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 Allenmichael was last seen on 10/17/2021.  Today he reports feeling fair. He reports fatigue over the past 2 days. He also reports haematemesis, and 2 episodes of black stool. He also reports falling out of his bed in his sleep. He denies history of stomach ulcer and NSAID use.   REVIEW OF SYSTEMS:  Review of Systems  Constitutional:  Positive for fatigue. Negative for appetite change.  Respiratory:  Positive for shortness of breath.   Gastrointestinal:  Positive for blood in stool (black stools), nausea and vomiting (haematemesis).  Musculoskeletal:  Positive for arthralgias (7/10).  Neurological:  Positive for dizziness and numbness.  Psychiatric/Behavioral:  Positive for sleep disturbance. The patient is nervous/anxious.   All other systems reviewed and are negative.   PAST MEDICAL/SURGICAL HISTORY:  Past Medical History:  Diagnosis Date   Anxiety    Leukemia ([place])    Thyroid disease    No past surgical history on file.  SOCIAL HISTORY:  Social History   Socioeconomic History   Marital status: Married    Spouse name: Margarito Dehaas   Number of children: 1   Years of education: Not on file   Highest education level: High school graduate  Occupational History   Occupation: [employer]     CommentLoss adjuster, chartered  Tobacco Use   Smoking status: Never   Smokeless tobacco: Never  Vaping Use   Vaping Use: Never used  Substance and Sexual Activity   Alcohol use: Yes    Alcohol/week: 21.0 standard drinks of alcohol    Types: 21 Cans of beer per week   Drug use:  Not Currently   Sexual activity: Yes  Other Topics Concern   Not on file  Social History Narrative   Not on file   Social Determinants of Health   Financial Resource Strain: Low Risk  (06/14/2020)   Overall Financial Resource Strain (CARDIA)    Difficulty of Paying Living Expenses: Not hard at all  Food Insecurity: No Food Insecurity (06/14/2020)   Hunger Vital Sign    Worried About Running Out of Food in the Last Year: Never true    Ran Out of Food in the Last Year: Never true  Transportation Needs: No Transportation Needs (06/14/2020)   PRAPARE - Hydrologist (Medical): No    Lack of Transportation (Non-Medical): No  Physical Activity: Sufficiently Active (06/14/2020)   Exercise Vital Sign    Days of Exercise per Week: 5 days    Minutes of Exercise per Session: 150+ min  Stress: No Stress Concern Present (06/14/2020)   [place]    Feeling of Stress : Only a little  Social Connections: Unknown (06/14/2020)   Social Connection and Isolation Panel [NHANES]    Frequency of Communication with Friends and Family: Three times a week    Frequency of Social Gatherings with Friends and Family: Three times a week    Attends Religious Services: Not on file    Active Member of Clubs or Organizations: No    Attends  Club or Organization Meetings: Never    Marital Status: Married  Human resources officer Violence: Not At Risk (06/14/2020)   Humiliation, Afraid, Rape, and Kick questionnaire    Fear of Current or Ex-Partner: No    Emotionally Abused: No    Physically Abused: No    Sexually Abused: No    FAMILY HISTORY:  Family History  Problem Relation Age of Onset   Cervical cancer Mother    Stroke Father     CURRENT MEDICATIONS:  Current Outpatient Medications  Medication Sig Dispense Refill   allopurinol (ZYLOPRIM) 300 MG tablet Take 300 mg by mouth daily.     butalbital-acetaminophen-caffeine  (FIORICET) 50-325-40 MG tablet Take by mouth.     gabapentin (NEURONTIN) 300 MG capsule TAKE 1 CAPSULE BY MOUTH EVERYDAY AT BEDTIME 30 capsule 4   HYDROcodone-acetaminophen (NORCO/VICODIN) 5-325 MG tablet Take 1 tablet by mouth every 8 (eight) hours as needed. 90 tablet 0   levothyroxine (SYNTHROID) 200 MCG tablet TAKE 1 TABLET BY MOUTH DAILY BEFORE BREAKFAST 90 tablet 3   LORazepam (ATIVAN) 1 MG tablet Take 1 tablet (1 mg total) by mouth 3 (three) times daily as needed for anxiety. 90 tablet 5   predniSONE (STERAPRED UNI-PAK 21 TAB) 10 MG (21) TBPK tablet Take as directed. 21 tablet 0   sitaGLIPtin (JANUVIA) 100 MG tablet Take 1 tablet (100 mg total) by mouth daily. 90 tablet 4   No current facility-administered medications for this visit.    ALLERGIES:  Allergies  Allergen Reactions   Levaquin [Levofloxacin]    Pecan Nut (Diagnostic)     PHYSICAL EXAM:  Performance status (ECOG): 0 - Asymptomatic  There were no vitals filed for this visit. Wt Readings from Last 3 Encounters:  10/17/21 231 lb 12.8 oz (105.1 kg)  08/08/21 235 lb 6.4 oz (106.8 kg)  06/06/21 231 lb (104.8 kg)   Physical Exam Vitals reviewed.  Constitutional:      Appearance: Normal appearance. He is obese.  Cardiovascular:     Rate and Rhythm: Normal rate and regular rhythm.     Pulses: Normal pulses.     Heart sounds: Normal heart sounds.  Pulmonary:     Effort: Pulmonary effort is normal.     Breath sounds: Normal breath sounds.  Neurological:     General: No focal deficit present.     Mental Status: He is alert and oriented to person, place, and time.  Psychiatric:        Mood and Affect: Mood normal.        Behavior: Behavior normal.     LABORATORY DATA:  I have reviewed the labs as listed.     Latest Ref Rng & Units 02/01/2022   10:34 AM 10/17/2021    9:39 AM 08/08/2021   10:39 AM  CBC  WBC 4.0 - 10.5 K/uL 4.1  3.2  3.5   Hemoglobin 13.0 - 17.0 g/dL 8.9  11.8  12.2   Hematocrit 39.0 - 52.0 %  27.9  35.4  36.3   Platelets 150 - 400 K/uL 55  50  59       Latest Ref Rng & Units 02/01/2022   10:34 AM 10/17/2021    9:39 AM 08/08/2021   10:39 AM  CMP  Glucose 70 - 99 mg/dL 138  110  115   BUN 6 - 20 mg/dL '28  15  15   '$ Creatinine 0.61 - 1.24 mg/dL 0.86  0.79  0.75   Sodium 135 - 145 mmol/L 140  142  135   Potassium 3.5 - 5.1 mmol/L 3.6  3.8  3.4   Chloride 98 - 111 mmol/L 109  107  105   CO2 22 - 32 mmol/L '21  25  22   '$ Calcium 8.9 - 10.3 mg/dL 8.7  8.9  8.3   Total Protein 6.5 - 8.1 g/dL 7.3  8.3  8.2   Total Bilirubin 0.3 - 1.2 mg/dL 1.4  1.3  1.5   Alkaline Phos 38 - 126 U/L 62  81  100   AST 15 - 41 U/L 133  125  111   ALT 0 - 44 U/L 42  34  35       Component Value Date/Time   RBC 2.87 (L) 02/01/2022 1034   MCV 97.2 02/01/2022 1034   MCH 31.0 02/01/2022 1034   MCHC 31.9 02/01/2022 1034   RDW 17.9 (H) 02/01/2022 1034   LYMPHSABS 1.5 02/01/2022 1034   MONOABS 0.4 02/01/2022 1034   EOSABS 0.0 02/01/2022 1034   BASOSABS 0.1 02/01/2022 1034    DIAGNOSTIC IMAGING:  I have independently reviewed the scans and discussed with the patient. No results found.   ASSESSMENT:  1.  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 -Nilotinib 300 mg twice daily started on July 05, 2012, dose reduced to 150 mg daily Monday through Friday in April 2015. -BCR/ABL by PCR has been negative since April 2017. - Nilotinib was discontinued on 02/13/2021.   2.  Thrombocytopenia: -He has mild to moderate thrombocytopenia since the start of admit. -MRI of the abdomen on 05/20/2018 shows normal-sized spleen but cirrhotic liver configuration.   3.  Bilateral subdural hematoma: - He fell around [facility], developed right foot drop and headaches and right-sided weakness. - Evaluated at [facility], underwent burr hole craniotomy by Dr. Vevelyn Francois on 01/29/2021.  Required 2 units of platelets.    PLAN:  1.  CML in chronic  phase: - He has been off of Nilotinib since August 2022.  Last BCR/ABL by quantitative PCR was negative on 10/17/2021.  We have sent another level which is pending from today. - CBC shows his white count is normal at 4.1 with normal differential.  However his hemoglobin dropped to 8.9 from 12 (his baseline). - He reportedly had 2 episodes of hematemesis in the last couple of days.  He also has melena. - Differential diagnosis includes variceal bleed/gastritis/peptic ulcer. - Recommend immediate evaluation in the ER with EGD. - We will give him follow-up appointment in 3 months.  He would like to go to [facility] ER.   2.  Thrombocytopenia: - Platelet count is stable at 55.  No bleeding other than recent hematemesis reported.   3.  Elevated LFTs: - Elevated AST at 133 is stable.  Total bilirubin is 1.4.  This is from underlying cirrhosis.   4.  Hypothyroidism: - Continue Synthroid 200 mcg daily.  TSH today is 3.1.   5.  Hyperlipidemia: - LDL is 115.  He could not tolerate statins in the past.  HDL and total cholesterol were normal. - We will discuss with Dr. Brynda Greathouse regarding nonstatin options.   6.  Diabetes: - Last A1c was 5.6 on 10/17/2021.  Continue Januvia 100 mg daily.  Orders placed this encounter:  No orders of the defined types were placed in this encounter.    Derek Jack, MD [place] [phone]   I, Thana Ates, am acting  as a scribe for Dr. Derek Jack.  I, Derek Jack MD, have reviewed the above documentation for accuracy and completeness, and I agree with the above.

## 2022-02-01 NOTE — Patient Instructions (Addendum)
[place] at [facility] Discharge Instructions   You were seen and examined today by Dr. Delton Coombes.  He reviewed the results of your lab work. Your hemoglobin has dropped by 3 points. You need to see a GI doctor to be evaluated for bleeding in your GI tract. You have decided you will go through the ER in [place] due to feeling poorly. It is apparent you are losing blood due to black stools and vomiting blood.   Return as scheduled in 3 months.    Thank you for choosing [place] at [facility] to provide your oncology and hematology care.  To afford each patient quality time with our provider, please arrive at least 15 minutes before your scheduled appointment time.   If you have a lab appointment with the [facility] please come in thru the Main Entrance and check in at the main information desk.  You need to re-schedule your appointment should you arrive 10 or more minutes late.  We strive to give you quality time with our providers, and arriving late affects you and other patients whose appointments are after yours.  Also, if you no show three or more times for appointments you may be dismissed from the clinic at the providers discretion.     Again, thank you for choosing [facility].  Our hope is that these requests will decrease the amount of time that you wait before being seen by our physicians.       _____________________________________________________________  Should you have questions after your visit to [facility], please contact our office at [phone] and follow the prompts.  Our office hours are 8:00 a.m. and 4:30 p.m. Monday - Friday.  Please note that voicemails left after 4:00 p.m. may not be returned until the following business day.  We are closed weekends and major holidays.  You do have access to a nurse 24-7, just call the main number to the clinic [phone] and do not press any options,  hold on the line and a nurse will answer the phone.    For prescription refill requests, have your pharmacy contact our office and allow 72 hours.    Due to Covid, you will need to wear a mask upon entering the hospital. If you do not have a mask, a mask will be given to you at the Main Entrance upon arrival. For doctor visits, patients may have 1 support person age 41 or older with them. For treatment visits, patients can not have anyone with them due to social distancing guidelines and our immunocompromised population.

## 2022-02-09 LAB — BCR-ABL1, CML/ALL, PCR, QUANT: Interpretation (BCRAL):: NEGATIVE

## 2022-02-15 ENCOUNTER — Other Ambulatory Visit (HOSPITAL_COMMUNITY): Payer: Self-pay | Admitting: *Deleted

## 2022-02-27 ENCOUNTER — Other Ambulatory Visit: Payer: Self-pay | Admitting: *Deleted

## 2022-02-27 DIAGNOSIS — C9210 Chronic myeloid leukemia, BCR/ABL-positive, not having achieved remission: Secondary | ICD-10-CM

## 2022-02-27 MED ORDER — HYDROCODONE-ACETAMINOPHEN 5-325 MG PO TABS: 1.0000 | ORAL_TABLET | Freq: Three times a day (TID) | ORAL | 0 refills | Status: DC | PRN

## 2022-03-22 ENCOUNTER — Other Ambulatory Visit: Payer: Self-pay | Admitting: *Deleted

## 2022-03-22 DIAGNOSIS — C9210 Chronic myeloid leukemia, BCR/ABL-positive, not having achieved remission: Secondary | ICD-10-CM

## 2022-03-23 ENCOUNTER — Other Ambulatory Visit: Payer: Self-pay

## 2022-03-23 DIAGNOSIS — C9210 Chronic myeloid leukemia, BCR/ABL-positive, not having achieved remission: Secondary | ICD-10-CM

## 2022-03-23 MED ORDER — LORAZEPAM 1 MG PO TABS: 1.0000 mg | ORAL_TABLET | Freq: Three times a day (TID) | ORAL | 0 refills | Status: DC | PRN

## 2022-03-26 ENCOUNTER — Other Ambulatory Visit: Payer: Self-pay | Admitting: *Deleted

## 2022-03-26 DIAGNOSIS — C9210 Chronic myeloid leukemia, BCR/ABL-positive, not having achieved remission: Secondary | ICD-10-CM

## 2022-03-26 MED ORDER — HYDROCODONE-ACETAMINOPHEN 5-325 MG PO TABS: 1.0000 | ORAL_TABLET | Freq: Three times a day (TID) | ORAL | 0 refills | Status: DC | PRN

## 2022-04-12 ENCOUNTER — Other Ambulatory Visit: Payer: Self-pay | Admitting: *Deleted

## 2022-04-12 DIAGNOSIS — C9210 Chronic myeloid leukemia, BCR/ABL-positive, not having achieved remission: Secondary | ICD-10-CM

## 2022-04-26 ENCOUNTER — Other Ambulatory Visit: Payer: Self-pay | Admitting: Physician Assistant

## 2022-04-26 ENCOUNTER — Other Ambulatory Visit: Payer: Self-pay

## 2022-04-26 DIAGNOSIS — C9210 Chronic myeloid leukemia, BCR/ABL-positive, not having achieved remission: Secondary | ICD-10-CM

## 2022-04-26 MED ORDER — HYDROCODONE-ACETAMINOPHEN 5-325 MG PO TABS: 1.0000 | ORAL_TABLET | Freq: Three times a day (TID) | ORAL | 0 refills | Status: DC | PRN

## 2022-04-26 MED ORDER — LORAZEPAM 1 MG PO TABS: 1.0000 mg | ORAL_TABLET | Freq: Three times a day (TID) | ORAL | 0 refills | Status: DC | PRN

## 2022-04-30 ENCOUNTER — Other Ambulatory Visit: Payer: Self-pay | Admitting: *Deleted

## 2022-05-07 ENCOUNTER — Ambulatory Visit: Payer: 59 | Admitting: Hematology

## 2022-05-07 ENCOUNTER — Other Ambulatory Visit: Payer: 59

## 2022-05-09 ENCOUNTER — Inpatient Hospital Stay: Payer: 59 | Attending: Hematology | Admitting: Hematology

## 2022-05-09 ENCOUNTER — Inpatient Hospital Stay: Payer: 59

## 2022-05-09 ENCOUNTER — Other Ambulatory Visit: Payer: Self-pay | Admitting: *Deleted

## 2022-05-09 VITALS — BP 121/80 | HR 111 | Temp 98.4°F | Resp 16 | Wt 243.3 lb

## 2022-05-09 DIAGNOSIS — C9210 Chronic myeloid leukemia, BCR/ABL-positive, not having achieved remission: Secondary | ICD-10-CM | POA: Insufficient documentation

## 2022-05-09 DIAGNOSIS — M2550 Pain in unspecified joint: Secondary | ICD-10-CM | POA: Diagnosis not present

## 2022-05-09 DIAGNOSIS — E039 Hypothyroidism, unspecified: Secondary | ICD-10-CM | POA: Insufficient documentation

## 2022-05-09 DIAGNOSIS — E119 Type 2 diabetes mellitus without complications: Secondary | ICD-10-CM | POA: Diagnosis not present

## 2022-05-09 DIAGNOSIS — Z7984 Long term (current) use of oral hypoglycemic drugs: Secondary | ICD-10-CM | POA: Insufficient documentation

## 2022-05-09 DIAGNOSIS — E785 Hyperlipidemia, unspecified: Secondary | ICD-10-CM | POA: Insufficient documentation

## 2022-05-09 DIAGNOSIS — K7460 Unspecified cirrhosis of liver: Secondary | ICD-10-CM | POA: Diagnosis not present

## 2022-05-09 DIAGNOSIS — D509 Iron deficiency anemia, unspecified: Secondary | ICD-10-CM

## 2022-05-09 DIAGNOSIS — R7401 Elevation of levels of liver transaminase levels: Secondary | ICD-10-CM | POA: Insufficient documentation

## 2022-05-09 DIAGNOSIS — E038 Other specified hypothyroidism: Secondary | ICD-10-CM

## 2022-05-09 LAB — CBC WITH DIFFERENTIAL/PLATELET
Abs Immature Granulocytes: 0 10*3/uL (ref 0.00–0.07)
Basophils Absolute: 0.1 10*3/uL (ref 0.0–0.1)
Basophils Relative: 3 %
Eosinophils Absolute: 0 10*3/uL (ref 0.0–0.5)
Eosinophils Relative: 0 %
HCT: 33 % — ABNORMAL LOW (ref 39.0–52.0)
Hemoglobin: 10.2 g/dL — ABNORMAL LOW (ref 13.0–17.0)
Immature Granulocytes: 0 %
Lymphocytes Relative: 38 %
Lymphs Abs: 1.4 10*3/uL (ref 0.7–4.0)
MCH: 24.3 pg — ABNORMAL LOW (ref 26.0–34.0)
MCHC: 30.9 g/dL (ref 30.0–36.0)
MCV: 78.8 fL — ABNORMAL LOW (ref 80.0–100.0)
Monocytes Absolute: 0.5 10*3/uL (ref 0.1–1.0)
Monocytes Relative: 13 %
Neutro Abs: 1.8 10*3/uL (ref 1.7–7.7)
Neutrophils Relative %: 46 %
Platelets: 90 10*3/uL — ABNORMAL LOW (ref 150–400)
RBC: 4.19 MIL/uL — ABNORMAL LOW (ref 4.22–5.81)
RDW: 20 % — ABNORMAL HIGH (ref 11.5–15.5)
Smear Review: DECREASED
WBC: 3.8 10*3/uL — ABNORMAL LOW (ref 4.0–10.5)
nRBC: 0 % (ref 0.0–0.2)

## 2022-05-09 LAB — LIPID PANEL
Cholesterol: 174 mg/dL (ref 0–200)
HDL: 32 mg/dL — ABNORMAL LOW (ref >40)
LDL Cholesterol: 128 mg/dL — ABNORMAL HIGH (ref 0–99)
Total CHOL/HDL Ratio: 5.4 RATIO
Triglycerides: 68 mg/dL (ref <150)
VLDL: 14 mg/dL (ref 0–40)

## 2022-05-09 LAB — COMPREHENSIVE METABOLIC PANEL
ALT: 26 U/L (ref 0–44)
AST: 97 U/L — ABNORMAL HIGH (ref 15–41)
Albumin: 3.8 g/dL (ref 3.5–5.0)
Alkaline Phosphatase: 99 U/L (ref 38–126)
Anion gap: 11 (ref 5–15)
BUN: 12 mg/dL (ref 6–20)
CO2: 21 mmol/L — ABNORMAL LOW (ref 22–32)
Calcium: 8.9 mg/dL (ref 8.9–10.3)
Chloride: 104 mmol/L (ref 98–111)
Creatinine, Ser: 0.89 mg/dL (ref 0.61–1.24)
GFR, Estimated: >60 mL/min (ref >60)
Glucose, Bld: 117 mg/dL — ABNORMAL HIGH (ref 70–99)
Potassium: 3.2 mmol/L — ABNORMAL LOW (ref 3.5–5.1)
Sodium: 136 mmol/L (ref 135–145)
Total Bilirubin: 1.6 mg/dL — ABNORMAL HIGH (ref 0.3–1.2)
Total Protein: 8.8 g/dL — ABNORMAL HIGH (ref 6.5–8.1)

## 2022-05-09 LAB — FERRITIN: Ferritin: 21 ng/mL — ABNORMAL LOW (ref 24–336)

## 2022-05-09 LAB — LACTATE DEHYDROGENASE: LDH: 192 U/L (ref 98–192)

## 2022-05-09 LAB — IRON AND TIBC
Iron: 47 ug/dL (ref 45–182)
Saturation Ratios: 8 % — ABNORMAL LOW (ref 17.9–39.5)
TIBC: 603 ug/dL — ABNORMAL HIGH (ref 250–450)
UIBC: 556 ug/dL

## 2022-05-09 LAB — TRIGLYCERIDES: Triglycerides: 68 mg/dL (ref <150)

## 2022-05-09 LAB — TSH: TSH: 7.19 u[IU]/mL — ABNORMAL HIGH (ref 0.350–4.500)

## 2022-05-09 MED ORDER — OXYCODONE HCL 10 MG PO TABS: 10.0000 mg | ORAL_TABLET | Freq: Three times a day (TID) | ORAL | 0 refills | Status: DC

## 2022-05-09 NOTE — Patient Instructions (Addendum)
[place] at [facility] Discharge Instructions   You were seen and examined today by Dr. Delton Coombes.  He reviewed the results of you lab work which are normal/stable.   He recommends you get iron IV. You are likely not absorbing iron due to taking Protonix. You may try taking iron tablets for now. You should take 1 tablet (325 mg) daily. You may get this over the counter. These tablets may cause constipation. If you experience constipation, take a stool softener.   Have your blood work checked with your primary care doctor in 6 weeks. Call the clinic with those results. If the iron has not come up after taking iron tablets, we will need to arrange for you to have IV iron.   You may try melatonin over the counter to help with sleep.   We will see you back in 4 months.    Thank you for choosing [place] at [facility] to provide your oncology and hematology care.  To afford each patient quality time with our provider, please arrive at least 15 minutes before your scheduled appointment time.   If you have a lab appointment with the [facility] please come in thru the Main Entrance and check in at the main information desk.  You need to re-schedule your appointment should you arrive 10 or more minutes late.  We strive to give you quality time with our providers, and arriving late affects you and other patients whose appointments are after yours.  Also, if you no show three or more times for appointments you may be dismissed from the clinic at the providers discretion.     Again, thank you for choosing [facility].  Our hope is that these requests will decrease the amount of time that you wait before being seen by our physicians.       _____________________________________________________________  Should you have questions after your visit to [facility], please contact our office at [phone] and follow the  prompts.  Our office hours are 8:00 a.m. and 4:30 p.m. Monday - Friday.  Please note that voicemails left after 4:00 p.m. may not be returned until the following business day.  We are closed weekends and major holidays.  You do have access to a nurse 24-7, just call the main number to the clinic [phone] and do not press any options, hold on the line and a nurse will answer the phone.    For prescription refill requests, have your pharmacy contact our office and allow 72 hours.    Due to Covid, you will need to wear a mask upon entering the hospital. If you do not have a mask, a mask will be given to you at the Main Entrance upon arrival. For doctor visits, patients may have 1 support person age 66 or older with them. For treatment visits, patients can not have anyone with them due to social distancing guidelines and our immunocompromised population.

## 2022-05-09 NOTE — Progress Notes (Signed)
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, a 56 y.o. male, seen for follow-up of CML.  He has newly diagnosed cirrhosis and esophageal varices and underwent EGD x2 with banding.  Since then he denies any bleeding per rectum or melena.  Does not report any fevers, night sweats or weight Samuel.  Reports slight worsening of joint pains.  He is currently taking hydrocodone.  REVIEW OF SYSTEMS:  Review of Systems  Respiratory:  Positive for shortness of breath.   Cardiovascular:  Positive for palpitations.  Gastrointestinal:  Positive for nausea.  Neurological:  Positive for dizziness and numbness.  Psychiatric/Behavioral:  Positive for sleep disturbance. The patient is nervous/anxious.   All other systems reviewed and are negative.   PAST MEDICAL/SURGICAL HISTORY:  Past Medical History:  Diagnosis Date   Anxiety    Leukemia ([place])    Thyroid disease    No past surgical history on file.  SOCIAL HISTORY:  Social History   Socioeconomic History   Marital status: Married    Spouse name: Samuel Gonzales   Number of children: 1   Years of education: Not on file   Highest education level: High school graduate  Occupational History   Occupation: Samuel Gonzales     CommentLoss adjuster, Gonzales  Tobacco Use   Smoking status: Never   Smokeless tobacco: Never  Vaping Use   Vaping Use: Never used  Substance and Sexual Activity   Alcohol use: Yes    Alcohol/week: 21.0 standard drinks of alcohol    Types: 21 Cans of beer per week   Drug use: Not Currently   Sexual activity: Yes  Other Topics Concern   Not on file  Social History Narrative   Not on file   Social Determinants of Health    Financial Resource Strain: Low Risk  (06/14/2020)   Overall Financial Resource Strain (CARDIA)    Difficulty of Paying Living Expenses: Not hard at all  Food Insecurity: No Food Insecurity (06/14/2020)   Hunger Vital Sign    Worried About Running Out of Food in the Last Year: Never true    Ran Out of Food in the Last Year: Never true  Transportation Needs: No Transportation Needs (06/14/2020)   PRAPARE - Hydrologist (Medical): No    Lack of Transportation (Non-Medical): No  Physical Activity: Sufficiently Active (06/14/2020)   Exercise Vital Sign    Days of Exercise per Week: 5 days    Minutes of Exercise per Session: 150+ min  Stress: No Stress Concern Present (06/14/2020)   Samuel Gonzales    Feeling of Stress : Only a little  Social Connections: Unknown (06/14/2020)   Social Connection and Isolation Panel [NHANES]    Frequency of Communication with Friends and Family: Three times a week    Frequency of Social Gatherings with Friends and Family: Three times a week    Attends Religious Services: Not on file    Active Member of Clubs or Organizations: No    Attends Club or Organization Meetings: Never    Marital Status: Married  Human resources officer Violence: Not At Risk (06/14/2020)   Humiliation, Afraid, Rape, and  Kick questionnaire    Fear of Current or Ex-Partner: No    Emotionally Abused: No    Physically Abused: No    Sexually Abused: No    FAMILY HISTORY:  Family History  Problem Relation Age of Onset   Samuel Gonzales    Samuel Gonzales     CURRENT MEDICATIONS:  Current Outpatient Medications  Medication Sig Dispense Refill   allopurinol (ZYLOPRIM) 300 MG tablet Take 300 mg by mouth daily.     butalbital-acetaminophen-caffeine (FIORICET) 50-325-40 MG tablet Take by mouth.     gabapentin (NEURONTIN) 300 MG capsule TAKE 1 CAPSULE BY MOUTH EVERYDAY AT BEDTIME 30 capsule 4    HYDROcodone-acetaminophen (NORCO/VICODIN) 5-325 MG tablet Take 1 tablet by mouth every 8 (eight) hours as needed. 90 tablet 0   levothyroxine (SYNTHROID) 200 MCG tablet TAKE 1 TABLET BY MOUTH DAILY BEFORE BREAKFAST 90 tablet 3   lidocaine (XYLOCAINE) 2 % solution by Transmucosal route.     LORazepam (ATIVAN) 1 MG tablet Take 1 tablet (1 mg total) by mouth 3 (three) times daily as needed for anxiety. 90 tablet 0   nilotinib (TASIGNA) 150 MG capsule Take 150 mg by mouth daily. Take on an empty stomach, at least one hour before or two hours after food.     pantoprazole (PROTONIX) 40 MG tablet SMARTSIG:1 Tablet(s) By Mouth Morning-Evening     predniSONE (STERAPRED UNI-PAK 21 TAB) 10 MG (21) TBPK tablet Take as directed. 21 tablet 0   sitaGLIPtin (JANUVIA) 100 MG tablet Take 1 tablet (100 mg total) by mouth daily. 90 tablet 4   sucralfate (CARAFATE) 1 g tablet Take by mouth.     Oxycodone HCl 10 MG TABS Take 1 tablet (10 mg total) by mouth every 8 (eight) hours. 90 tablet 0   No current facility-administered medications for this visit.    ALLERGIES:  Allergies  Allergen Reactions   Other Anaphylaxis    PECANS   Shellfish Allergy Anaphylaxis   Sertraline Hives   Levaquin [Levofloxacin]    Pecan Nut (Diagnostic)     PHYSICAL EXAM:  Performance status (ECOG): 0 - Asymptomatic  Vitals:   05/09/22 1015  BP: 121/80  Pulse: (!) 111  Resp: 16  Temp: 98.4 F (36.9 C)  SpO2: 99%   Wt Readings from Last 3 Encounters:  05/09/22 243 lb 4.8 oz (110.4 kg)  02/01/22 236 lb 3.2 oz (107.1 kg)  10/17/21 231 lb 12.8 oz (105.1 kg)   Physical Exam Vitals reviewed.  Constitutional:      Appearance: Normal appearance. He is obese.  Cardiovascular:     Rate and Rhythm: Normal rate and regular rhythm.     Pulses: Normal pulses.     Heart sounds: Normal heart sounds.  Pulmonary:     Effort: Pulmonary effort is normal.     Breath sounds: Normal breath sounds.  Neurological:     General: No focal  deficit present.     Mental Status: He is alert and oriented to person, place, and time.  Psychiatric:        Mood and Affect: Mood normal.        Behavior: Behavior normal.     LABORATORY DATA:  I have reviewed the labs as listed.     Latest Ref Rng & Units 05/09/2022    9:08 AM 02/01/2022   10:34 AM 10/17/2021    9:39 AM  CBC  WBC 4.0 - 10.5 K/uL 3.8  4.1  3.2   Hemoglobin 13.0 -  17.0 g/dL 10.2  8.9  11.8   Hematocrit 39.0 - 52.0 % 33.0  27.9  35.4   Platelets 150 - 400 K/uL 90  55  50       Latest Ref Rng & Units 05/09/2022    9:08 AM 02/01/2022   10:34 AM 10/17/2021    9:39 AM  CMP  Glucose 70 - 99 mg/dL 117  138  110   BUN 6 - 20 mg/dL '12  28  15   '$ Creatinine 0.61 - 1.24 mg/dL 0.89  0.86  0.79   Sodium 135 - 145 mmol/L 136  140  142   Potassium 3.5 - 5.1 mmol/L 3.2  3.6  3.8   Chloride 98 - 111 mmol/L 104  109  107   CO2 22 - 32 mmol/L '21  21  25   '$ Calcium 8.9 - 10.3 mg/dL 8.9  8.7  8.9   Total Protein 6.5 - 8.1 g/dL 8.8  7.3  8.3   Total Bilirubin 0.3 - 1.2 mg/dL 1.6  1.4  1.3   Alkaline Phos 38 - 126 U/L 99  62  81   AST 15 - 41 U/L 97  133  125   ALT 0 - 44 U/L 26  42  34       Component Value Date/Time   RBC 4.19 (L) 05/09/2022 0908   MCV 78.8 (L) 05/09/2022 0908   MCH 24.3 (L) 05/09/2022 0908   MCHC 30.9 05/09/2022 0908   RDW 20.0 (H) 05/09/2022 0908   LYMPHSABS 1.4 05/09/2022 0908   MONOABS 0.5 05/09/2022 0908   EOSABS 0.0 05/09/2022 0908   BASOSABS 0.1 05/09/2022 0908    DIAGNOSTIC IMAGING:  I have independently reviewed the scans and discussed with the patient. No results found.   ASSESSMENT:  1.  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 -Nilotinib 300 mg twice daily started on July 05, 2012, dose reduced to 150 mg daily Monday through Friday in April 2015. -BCR/ABL by PCR has been negative since April 2017. - Nilotinib was discontinued on 02/13/2021.   2.  Thrombocytopenia: -  This is from cirrhosis and splenomegaly. - EGD (02/03/2022): Large esophageal varices, status post 3 bands, portal hypertensive gastropathy.  No gastric varices. - EGD (04/25/2022): 3 columns of large esophageal varices.  Status post band ligation x5.  Portal hypertensive gastropathy.   3.  Bilateral subdural hematoma: - He fell around [facility], developed right foot drop and headaches and right-sided weakness. - Evaluated at [facility], underwent burr hole craniotomy by Dr. Vevelyn Francois on 01/29/2021.  Required 2 units of platelets.    PLAN:  1.  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 - Last BCR/ABL by quantitative PCR on 02/01/2022 was negative.  A level from today is pending. - Reviewed CBC today white count is 3.8 with normal differential. - He has microcytic anemia with hemoglobin 10.2.  We checked his ferritin which was down to 21 and percent saturation 8. - I have recommended starting iron tablet daily with stool softener. - He will check ferritin, iron panel and CBC in 6 weeks in [place].  He will fax the labs to [place].  If there is no improvement, will recommend parenteral iron therapy. - RTC 4 months for follow-up with repeat labs.   2.  Thrombocytopenia: - Platelet count has improved to 90.   3.  Elevated LFTs: - AST  is elevated at 97 and total bilirubin 1.6 from underlying cirrhosis.   4.  Hypothyroidism: - Continue Synthroid 200 mcg daily.  TSH today 7.1 up from 3.1.  He recently missed some doses.  Continue same dose.   5.  Hyperlipidemia: - LDL today is 128.  Total cholesterol is 174.  HDL is 32.  We will closely monitor.   6.  Diabetes: - Last A1c was 5.6.  Continue Januvia 100 mg daily.  7.  Body pains/joint pains: - He is taking hydrocodone 10/325 2-3 times daily as needed.  It is not helping. - He wondered if he should go back on Nilotinib as the pains were well controlled while he was on it.  I did not recommend it. - I will  change hydrocodone to oxycodone 10 mg every 8 hours as needed.  Orders placed this encounter:  Orders Placed This Encounter  Procedures   Iron and TIBC ([facility] ONLY)   Ferritin      Derek Jack, MD [employer] [place] [phone]

## 2022-05-16 LAB — BCR-ABL1, CML/ALL, PCR, QUANT: b2a2 transcript: 0.5498 %

## 2022-05-24 ENCOUNTER — Other Ambulatory Visit: Payer: Self-pay | Admitting: *Deleted

## 2022-05-24 DIAGNOSIS — C9210 Chronic myeloid leukemia, BCR/ABL-positive, not having achieved remission: Secondary | ICD-10-CM

## 2022-05-29 ENCOUNTER — Other Ambulatory Visit: Payer: Self-pay | Admitting: *Deleted

## 2022-05-29 DIAGNOSIS — C9210 Chronic myeloid leukemia, BCR/ABL-positive, not having achieved remission: Secondary | ICD-10-CM

## 2022-05-29 MED ORDER — LORAZEPAM 1 MG PO TABS: 1.0000 mg | ORAL_TABLET | Freq: Three times a day (TID) | ORAL | 0 refills | Status: DC | PRN

## 2022-05-29 MED ORDER — OXYCODONE HCL 10 MG PO TABS: 10.0000 mg | ORAL_TABLET | Freq: Three times a day (TID) | ORAL | 0 refills | Status: DC

## 2022-06-05 ENCOUNTER — Other Ambulatory Visit: Payer: Self-pay | Admitting: *Deleted

## 2022-06-05 DIAGNOSIS — E038 Other specified hypothyroidism: Secondary | ICD-10-CM

## 2022-06-05 DIAGNOSIS — C9210 Chronic myeloid leukemia, BCR/ABL-positive, not having achieved remission: Secondary | ICD-10-CM

## 2022-06-05 DIAGNOSIS — D509 Iron deficiency anemia, unspecified: Secondary | ICD-10-CM

## 2022-06-07 ENCOUNTER — Other Ambulatory Visit: Payer: Self-pay | Admitting: *Deleted

## 2022-06-07 MED ORDER — BUTALBITAL-APAP-CAFFEINE 50-325-40 MG PO TABS: 1.0000 | ORAL_TABLET | Freq: Four times a day (QID) | ORAL | 0 refills | Status: DC | PRN

## 2022-06-25 ENCOUNTER — Other Ambulatory Visit: Payer: Self-pay | Admitting: *Deleted

## 2022-06-25 DIAGNOSIS — C9210 Chronic myeloid leukemia, BCR/ABL-positive, not having achieved remission: Secondary | ICD-10-CM

## 2022-06-25 MED ORDER — OXYCODONE HCL 10 MG PO TABS: 10.0000 mg | ORAL_TABLET | Freq: Three times a day (TID) | ORAL | 0 refills | Status: DC

## 2022-06-25 MED ORDER — LORAZEPAM 1 MG PO TABS: 1.0000 mg | ORAL_TABLET | Freq: Three times a day (TID) | ORAL | 0 refills | Status: DC | PRN

## 2022-08-09 DEATH — deceased

## 2022-09-05 ENCOUNTER — Other Ambulatory Visit: Payer: 59

## 2022-09-05 ENCOUNTER — Ambulatory Visit: Payer: 59 | Admitting: Hematology

## 2023-05-01 ENCOUNTER — Telehealth: Payer: Self-pay

## 2023-05-01 NOTE — Telephone Encounter (Signed)
Call received from patient's wife Tylere Lina, who requests that a previously written letter be printed and faxed directly to her. Explained to Marcelino Duster that all requests of this nature will need to go through Medical Records. Marcelino Duster tells me that she is having issues accessing a link from Medical Records and would like for the office to fulfill request. Explained to her that she will need to reach out to medical records again for clarification regarding their link. Marcelino Duster also asks if a new letter can be drawn up and as I explained, the patient is deceased so we are unable to formulate a new and updated letter with current recommendations for care. Marcelino Duster expresses her dissatisfaction with my response though I explain there are appropriate processes to be followed due to the patient's status at this time.

## 2023-05-02 ENCOUNTER — Encounter: Payer: Self-pay | Admitting: *Deleted

## 2023-05-02 NOTE — Progress Notes (Signed)
Chart accessed to pull up previously sent letter from February 2023.  Printed off and was shredded, as wife was informed that records would need to be requested through medical records due to patient status.  Will reinforce the proper process if further communication is requested.
